# Patient Record
Sex: Female | Born: 1965 | Race: White | Hispanic: No | Marital: Married | Smoking: Current every day smoker
Health system: Southern US, Community
[De-identification: ages and names within clinical notes are randomized; demographics above are authoritative.]

## PROBLEM LIST (undated history)

## (undated) DIAGNOSIS — F419 Anxiety disorder, unspecified: Secondary | ICD-10-CM

## (undated) DIAGNOSIS — M4306 Spondylolysis, lumbar region: Secondary | ICD-10-CM

## (undated) DIAGNOSIS — I10 Essential (primary) hypertension: Secondary | ICD-10-CM

## (undated) DIAGNOSIS — M533 Sacrococcygeal disorders, not elsewhere classified: Secondary | ICD-10-CM

## (undated) DIAGNOSIS — F329 Major depressive disorder, single episode, unspecified: Secondary | ICD-10-CM

## (undated) DIAGNOSIS — F32A Depression, unspecified: Secondary | ICD-10-CM

## (undated) HISTORY — PX: ABDOMINAL HYSTERECTOMY: SHX81

## (undated) HISTORY — DX: Spondylolysis, lumbar region: M43.06

## (undated) HISTORY — PX: APPENDECTOMY: SHX54

## (undated) HISTORY — DX: Sacrococcygeal disorders, not elsewhere classified: M53.3

## (undated) HISTORY — DX: Anxiety disorder, unspecified: F41.9

## (undated) HISTORY — PX: CHOLECYSTECTOMY: SHX55

## (undated) HISTORY — DX: Essential (primary) hypertension: I10

---

## 2004-01-27 ENCOUNTER — Ambulatory Visit: Payer: Self-pay | Admitting: Internal Medicine

## 2005-08-22 ENCOUNTER — Emergency Department: Payer: Self-pay | Admitting: Emergency Medicine

## 2005-09-25 ENCOUNTER — Ambulatory Visit: Payer: Self-pay

## 2006-01-01 ENCOUNTER — Ambulatory Visit (HOSPITAL_COMMUNITY): Admission: RE | Admit: 2006-01-01 | Discharge: 2006-01-02 | Payer: Self-pay | Admitting: Neurosurgery

## 2006-05-15 ENCOUNTER — Ambulatory Visit: Payer: Self-pay | Admitting: Gastroenterology

## 2007-06-09 ENCOUNTER — Ambulatory Visit: Payer: Self-pay | Admitting: Internal Medicine

## 2007-07-11 ENCOUNTER — Ambulatory Visit: Payer: Self-pay | Admitting: Neurosurgery

## 2007-07-12 ENCOUNTER — Ambulatory Visit: Payer: Self-pay | Admitting: Neurosurgery

## 2009-02-02 ENCOUNTER — Emergency Department: Payer: Self-pay | Admitting: Emergency Medicine

## 2009-02-09 ENCOUNTER — Emergency Department: Payer: Self-pay | Admitting: Emergency Medicine

## 2009-02-14 ENCOUNTER — Emergency Department: Payer: Self-pay | Admitting: Internal Medicine

## 2010-03-20 ENCOUNTER — Emergency Department: Payer: Self-pay | Admitting: Internal Medicine

## 2011-01-15 ENCOUNTER — Ambulatory Visit: Payer: Self-pay | Admitting: Anesthesiology

## 2011-01-18 ENCOUNTER — Ambulatory Visit: Payer: Self-pay | Admitting: Unknown Physician Specialty

## 2011-03-12 ENCOUNTER — Emergency Department: Payer: Self-pay | Admitting: Emergency Medicine

## 2011-03-12 LAB — ETHANOL: Ethanol %: <0.003 % (ref 0.000–0.080)

## 2011-03-12 LAB — COMPREHENSIVE METABOLIC PANEL
Albumin: 4.3 g/dL (ref 3.4–5.0)
Anion Gap: 9 (ref 7–16)
Bilirubin,Total: 0.5 mg/dL (ref 0.2–1.0)
Chloride: 108 mmol/L — ABNORMAL HIGH (ref 98–107)
Co2: 28 mmol/L (ref 21–32)
Creatinine: 0.79 mg/dL (ref 0.60–1.30)
EGFR (Non-African Amer.): >60
Osmolality: 288 (ref 275–301)

## 2011-03-12 LAB — DRUG SCREEN, URINE
Amphetamines, Ur Screen: NEGATIVE (ref ?–1000)
Methadone, Ur Screen: NEGATIVE (ref ?–300)
Tricyclic, Ur Screen: NEGATIVE (ref ?–1000)

## 2011-03-12 LAB — CBC
HCT: 43.8 % (ref 35.0–47.0)
RBC: 4.94 10*6/uL (ref 3.80–5.20)
RDW: 14.3 % (ref 11.5–14.5)
WBC: 9.9 10*3/uL (ref 3.6–11.0)

## 2011-03-12 LAB — TSH: Thyroid Stimulating Horm: 3.68 u[IU]/mL

## 2011-06-25 ENCOUNTER — Ambulatory Visit: Payer: Self-pay | Admitting: Emergency Medicine

## 2011-06-25 LAB — HEPATIC FUNCTION PANEL A (ARMC)
Albumin: 3.8 g/dL (ref 3.4–5.0)
Bilirubin, Direct: <0.1 mg/dL (ref 0.00–0.20)
SGOT(AST): 16 U/L (ref 15–37)

## 2011-06-28 ENCOUNTER — Ambulatory Visit: Payer: Self-pay | Admitting: Emergency Medicine

## 2011-08-02 ENCOUNTER — Ambulatory Visit: Payer: Self-pay | Admitting: Emergency Medicine

## 2011-11-28 ENCOUNTER — Ambulatory Visit: Payer: Self-pay | Admitting: Internal Medicine

## 2011-12-31 ENCOUNTER — Ambulatory Visit: Payer: Self-pay | Admitting: Obstetrics & Gynecology

## 2011-12-31 LAB — CBC
HCT: 41.1 % (ref 35.0–47.0)
HGB: 13.7 g/dL (ref 12.0–16.0)
Platelet: 287 10*3/uL (ref 150–440)
RBC: 4.48 10*6/uL (ref 3.80–5.20)
RDW: 14 % (ref 11.5–14.5)
WBC: 7.6 10*3/uL (ref 3.6–11.0)

## 2011-12-31 LAB — PREGNANCY, URINE: Pregnancy Test, Urine: NEGATIVE m[IU]/mL

## 2012-01-03 ENCOUNTER — Ambulatory Visit: Payer: Self-pay | Admitting: Obstetrics & Gynecology

## 2012-06-25 ENCOUNTER — Ambulatory Visit: Payer: Self-pay | Admitting: Internal Medicine

## 2013-12-22 IMAGING — US TRANSABDOMINAL ULTRASOUND OF PELVIS
1 series · 14 of 25 positions shown · non-contrast
Comparison: none

REASON FOR EXAM: Persistent Pelvic Pain
COMMENTS:

PROCEDURE:     FILS - FILS PELVIS NON-OB W/TRANSVAGINAL  - November 28, 2011  [DATE]
RESULT:     Comparison: None
TECHNIQUE: Multiple transabdominal gray-scale images and endovaginal
gray-scale images with doppler images of the pelvis performed.

[Series 1: transabdominal ultrasound of pelvis · 0.26mm/px · 14 of 83 slices shown]
[im 1/83]
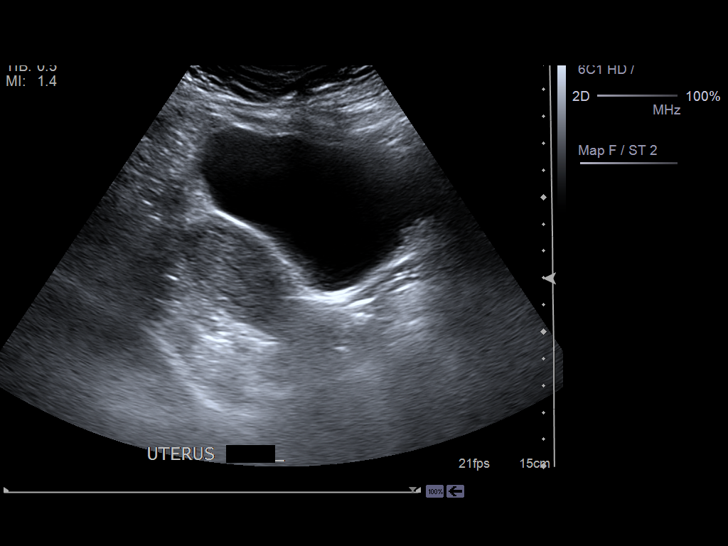
[im 7/83]
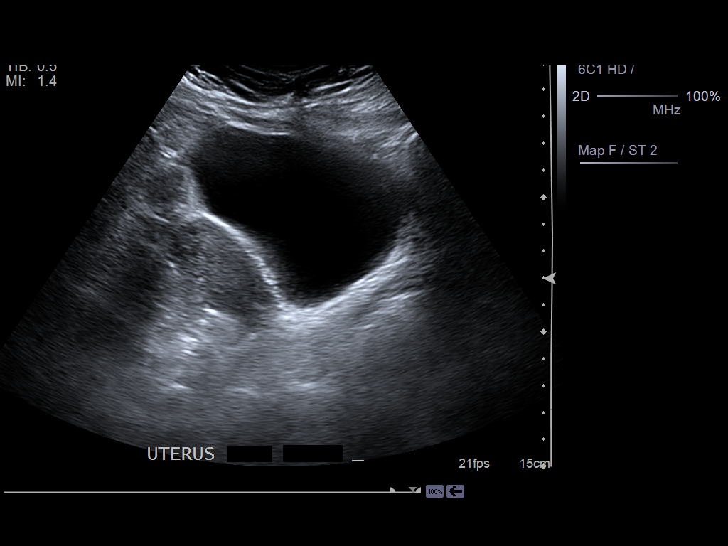
[im 14/83]
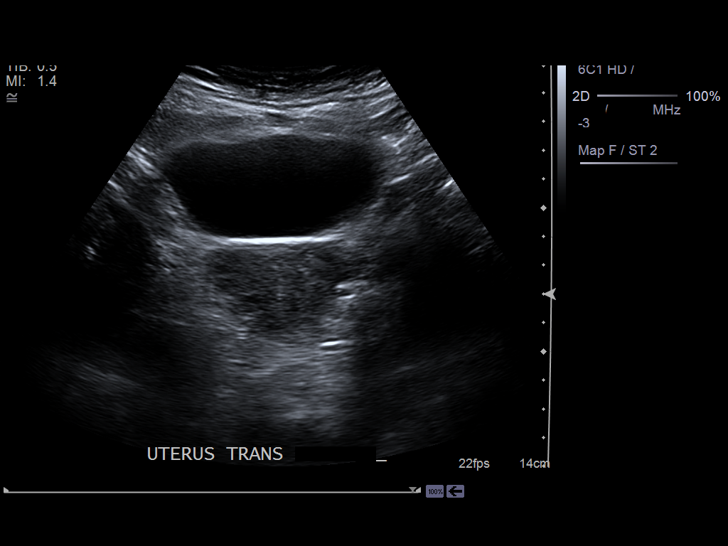
[im 21/83]
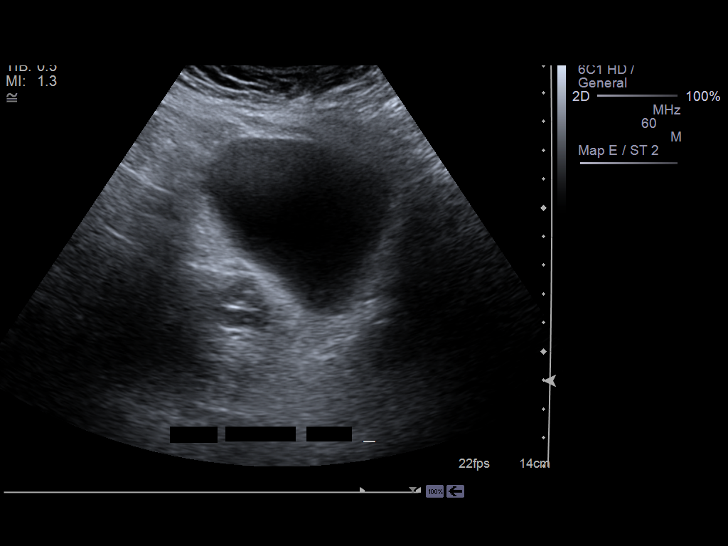
[im 28/83]
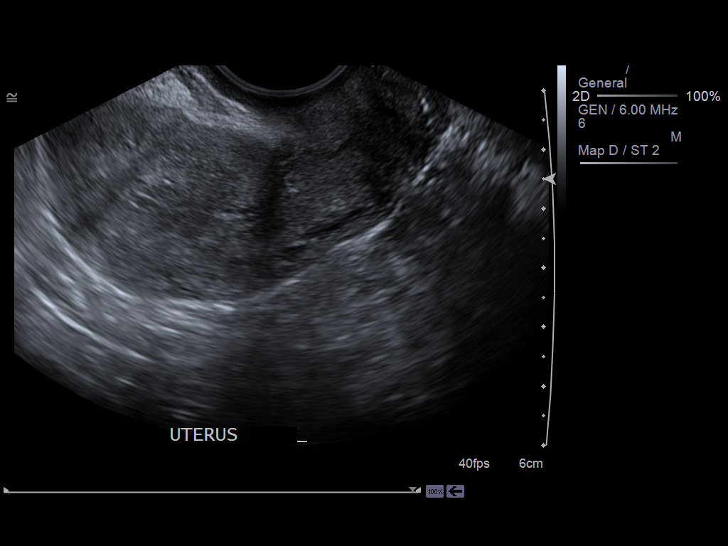
[im 31/83]
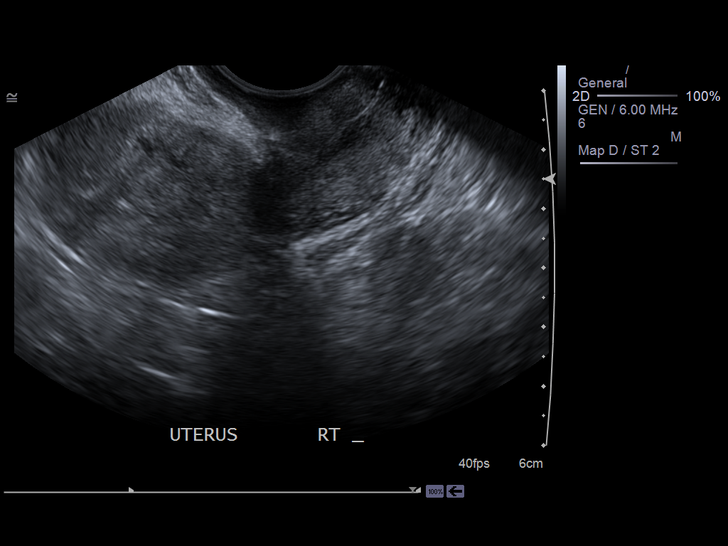
[im 38/83]
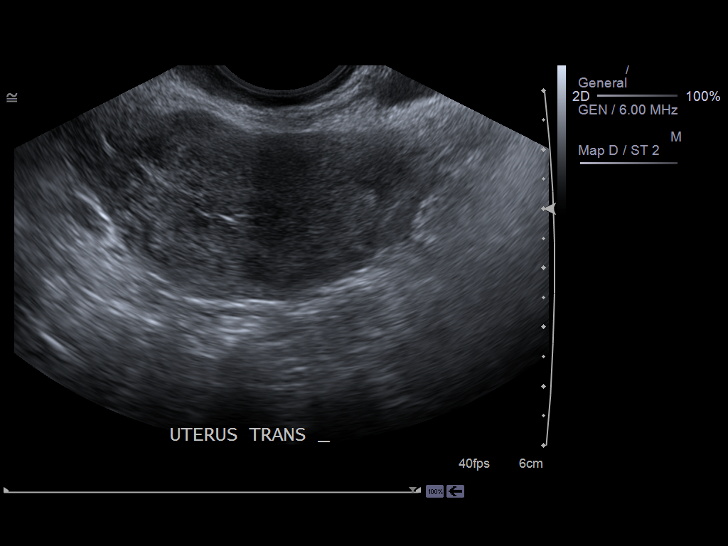
[im 45/83]
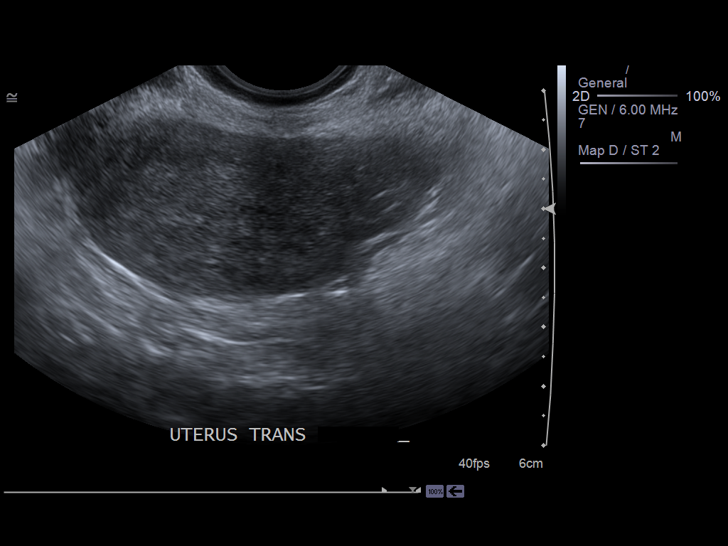
[im 52/83]
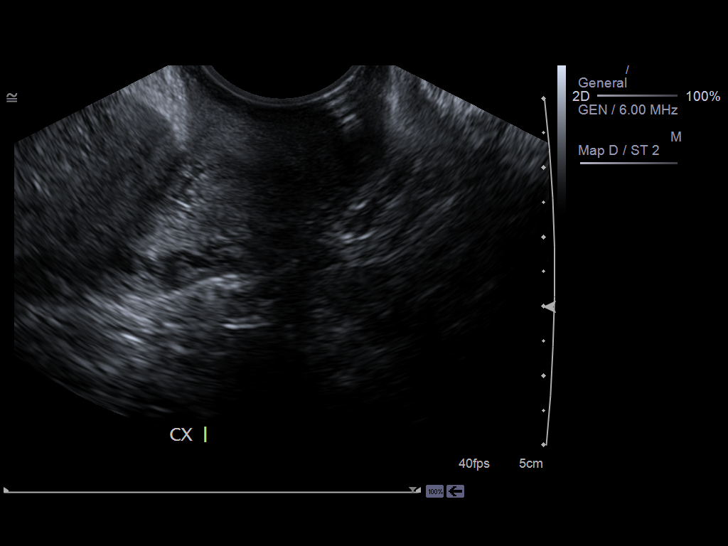
[im 55/83]
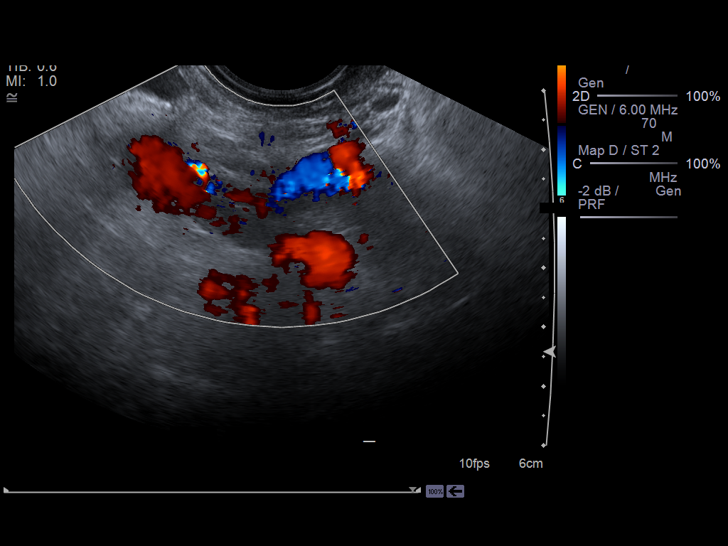
[im 62/83]
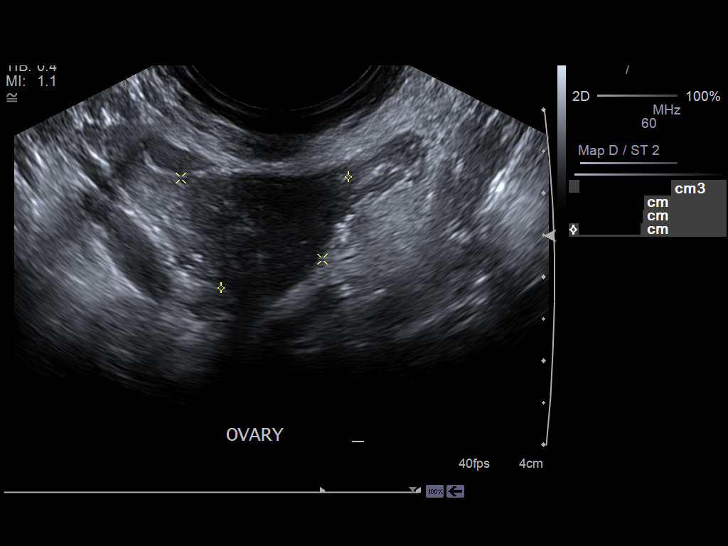
[im 69/83]
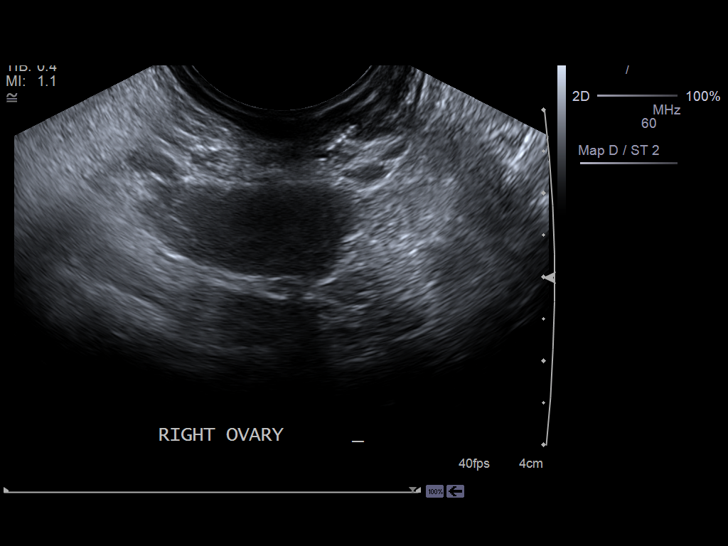
[im 76/83]
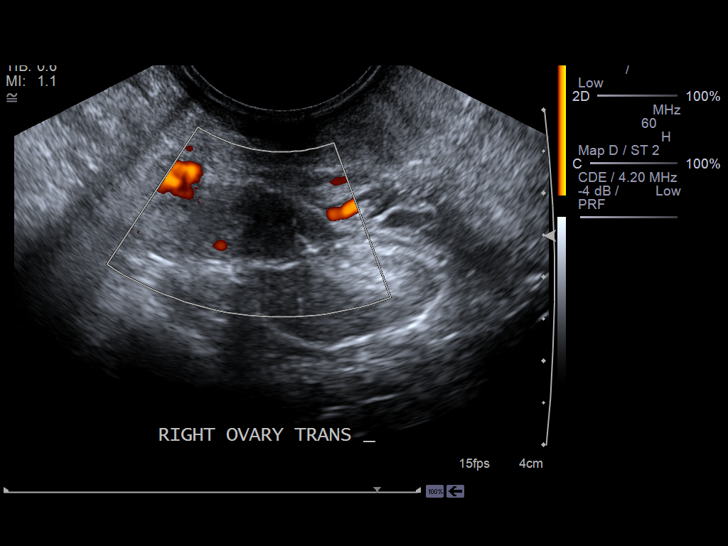
[im 83/83]
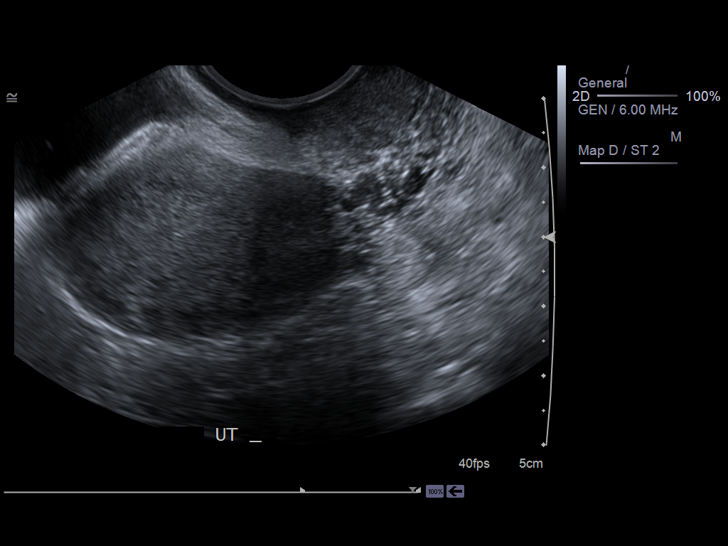

[14 of 25 positions shown; findings below may reference images not displayed]

FINDINGS: The uterus is normal in echotexture measuring 7.4 x 3.1 x 3.8 cm , with
transabdominal ultrasound. The endometrial stripe is uniform and homogeneous
measuring 3.4 mm.  There is a small calcification within the endometrial
cavity measuring 1.5 mm. There are no abnormal solid or cystic myometrial
mass lesions noted.

The right ovary measures 2.3 x 1.2 x 1.5 cm.  The left ovary measures 2 x 2
by 1.1 cm. There is no adnexal mass. There is prominent left pelvic
vasculature.

There is no pelvic free fluid.
IMPRESSION: No acute sonographic abnormality of the pelvis.

[REDACTED]

## 2014-06-01 NOTE — Op Note (Signed)
PATIENT NAME:  [name] MR#:  [id number] DATE OF BIRTH:  1965-03-16  DATE OF PROCEDURE:  01/03/2012  PREOPERATIVE DIAGNOSES:  1. Endometriosis. 2. Chronic pelvic pain. 3. Vulvar skin lesion.   POSTOPERATIVE DIAGNOSES: 1. Endometriosis. 2. Chronic pelvic pain. 3. Vulvar skin lesion.   PROCEDURE:  1. Laparoscopic supracervical hysterectomy with bilateral salpingo-oophorectomy. 2. Excision of vulvar skin lesion.   SURGEON: Glean Salen, MD   ASSISTANT: Erik Obey, MD    ANESTHESIA: General.   ESTIMATED BLOOD LOSS: 25 mL.   COMPLICATIONS: None.   FINDINGS: There was a bulbar skin lesion in the mons pubis area that was excised. There was significant endometriosis throughout the pelvic cavity including the uterus, fallopian tubes, posterior cul-de-sac, uterosacral ligaments. There was also a Filshie clip that was in the left ovarian fossa covered by peritoneum.   DISPOSITION: To recovery room in stable condition.   TECHNIQUE: The patient is prepped and draped in the usual sterile fashion after adequate anesthesia is obtained in the dorsal lithotomy position. A sponge stick is placed in the vagina for manipulation purposes and a Foley catheter is inserted. Vulvar skin lesion is identified. The skin in this area is infiltrated with 1% lidocaine with epinephrine. The lesion is grasped with an Allis clamp and excised with a scalpel. A 3-0 Vicryl suture is used to close the skin defect from this excision site followed by Dermabond.   Attention is then turned to the abdomen where a Veress needle is inserted through a 5 mm infraumbilical incision after Marcaine is used to anesthetize the skin. Veress needle placement is confirmed using the hanging drop technique and the abdomen is then insufflated with CO2 gas. A 5 mm trocar is then inserted under direct visualization with the laparoscope with no injuries or bleeding noted. The patient is placed in Trendelenburg positioning and the  above-mentioned findings are visualized.   A 5 mm trocar is placed in the left lower quadrant lateral to the inferior epigastric blood vessels and an 11 mm trocar is placed in the right lower quadrant lateral to the inferior epigastric blood vessels with no injuries or bleeding noted. The uterus, fallopian tubes, and ovaries were identified with the above-mentioned findings. The ureters are visually identified and out of harm's way throughout the dissection of the case. There is a band of adhesions from the anterior abdominal wall to the omentum. This is a fatty band of scar tissue with no bowel involvement. It is dissected free using the 5 mm Harmonic scalpel for better visualization purposes for the hysterectomy. The bowel is retracted superiorly and there was no apparent injury or involvement of the bowel throughout the surgery.   The uterus is grasped with a tenaculum along with the adnexa. The infundibulopelvic blood vessels and ligaments are carefully coagulated and cut using the bipolar cautery device along with the 5 mm Harmonic scalpel to completely excise the fallopian tubes and ovaries to the level of the round ligaments. Round ligaments are carefully coagulated and cut and dissection is carried down the broad ligament complex to the level of the uterine arteries. The uterine arteries are coagulated using bipolar cautery device. The bladder is dissected in an inferior direction. The uterus is amputated with approximately 1 cm of cervix left in place. The endocervical canal is cauterized. Excellent hemostasis is noted.   A morcellator device is placed through the right lower quadrant incision after the trocar is removed and then the uterus and adnexa is removed by morcellation process  without complication. The pelvic cavity is irrigated with aspiration of all fluid. Minimal blood loss is noted with currently excellent hemostasis. Ureters were visualized to be normal and without injury. Bladder is  also observed to be normal without injury and the bladder bag reveals clear yellow urine without gas, air, or blood. Interceed is placed over the cervical stump.   Fascial closure device is utilized to close the right lower quadrant incision at the level of the fascia using a Vicryl suture. Gas is then expelled through the trocars and they are removed carefully. As much gas as possible is removed from her abdomen. Skin incisions are closed with Dermabond at this time. The patient goes to the recovery room in stable condition. All sponge, instrument, and needle counts are correct.   ____________________________ R. Barnett Applebaum, MD rph:drc D: 01/03/2012 10:37:30 ET T: 01/03/2012 10:57:43 ET JOB#: [id number]  cc: Glean Salen, MD, <Dictator> Gae Dry MD ELECTRONICALLY SIGNED 01/04/2012 7:21

## 2014-06-06 NOTE — Op Note (Signed)
PATIENT NAME:  [name], MONTE MR#:  [id number] DATE OF BIRTH:  08-Apr-1965  DATE OF PROCEDURE:  08/02/2011  PREOPERATIVE DIAGNOSIS: Internal and external hemorrhoids and rectal bleeding.   POSTOPERATIVE DIAGNOSIS: Internal and external hemorrhoids and rectal bleeding.   PROCEDURE:  Hemorrhoidectomy.   SURGEON: Vella Kohler, MD  INDICATIONS: This is a patient who has been having bleeding from the rectum. She also had prolapse of a little bit of rectal mucosa. I did a colonoscopy which was basically normal. The patient was then brought to surgery. I explained to her the possibility of postoperative bleeding and rectal pain and formation of an anal fissure, in preop before surgery. The patient was then brought to surgery.   DESCRIPTION OF PROCEDURE: She was placed in a jackknife position. The buttocks were held apart.  There were three major areas of hemorrhoids and there were hemorrhoids in between. The one area at 7 o'clock position was then grasped with a lot of skin hanging and also a lot of skin tags were hanging with it.  A V-shaped incision was made on the skin around the hemorrhoid tissue and it was then dissected off the external sphincter and then with the Harmonic scalpel the hemorrhoids and mucosa was then taken off with multiple applications. The apex was then tied with 4-0 Vicryl sutures and then the suture was then brought in with interlocking sutures all the way down to the skin level.   Similarly, there were two more areas that were dealt with and one extra area at the 2 o'clock  position also was then dealt with. After this was done, we made sure there was mucosa in         between the hemorrhoidectomies and then we placed Xylocaine viscous on it and a dressing was then applied. I checked for bleeding, there was no bleeding. The patient was then discharged to the postoperative area. ____________________________ Welford Roche. Phylis Bougie, MD msh:slb D: 08/02/2011 08:50:32  ET T: 08/02/2011 10:19:42 ET JOB#: [id number]  cc: Jennyfer Nickolson S. Phylis Bougie, MD, <Dictator> Sharene Butters MD ELECTRONICALLY SIGNED 08/03/2011 14:42

## 2014-06-06 NOTE — Op Note (Signed)
PATIENT NAME:  [name], [name] MR#:  [id number] DATE OF BIRTH:  1965-10-28  DATE OF PROCEDURE:  01/18/2011  PREOPERATIVE DIAGNOSIS: Carpal tunnel syndrome, right side.  POSTOPERATIVE DIAGNOSIS: Carpal tunnel syndrome, right side.   PROCEDURE: Release of median nerve, right wrist.   SURGEON: Ronrico Dupin C. Nailah Luepke, MD   ASSISTANT: None.   ANESTHESIA: MAC.   ESTIMATED BLOOD LOSS: Negligible.   COMPLICATIONS: None.   BRIEF CLINICAL NOTE AND PATHOLOGY: The patient had documented bilateral carpal tunnel syndrome with significant pain. Options, risks, and benefits were discussed as she did not respond to conservative treatment. The patient elected to proceed with release. At the time of the procedure, there was marked compression of the median nerve with thickening of the transverse carpal ligament.   PROCEDURE: Preop antibiotics, adequate anesthesia, supine position, routine prepping and draping. Appropriate time-out was called. Routine incision was made paralleling the thenar crease in line with the ring finger. Subcutaneous tissues were dissected. Hemostasis was obtained with the bipolar cautery. The transverse carpal ligament was released. Proximal fascia was released subcutaneously. The carpal canal was explored and no further pathology noted. The incision was thoroughly irrigated. The skin was closed with Monocryl. Soft sterile dressing was applied. The patient was awakened and taken to the PAC-U having tolerated the procedure well.    ____________________________ Alysia Penna. Mauri Pole, MD jcc:drc D: 01/18/2011 14:51:16 ET T: 01/18/2011 15:20:24 ET JOB#: [id number] Alysia Penna Darshan Solanki MD ELECTRONICALLY SIGNED 02/13/2011 13:28

## 2014-06-06 NOTE — Op Note (Signed)
PATIENT NAME:  [name], [name] MR#:  [id number] DATE OF BIRTH:  [birth year]  DATE OF PROCEDURE:  06/28/2011  PREOPERATIVE DIAGNOSIS: Acute cholecystitis.   POSTOPERATIVE DIAGNOSIS: Acute cholecystitis.   PROCEDURE PERFORMED: Laparoscopic cholecystectomy with cholangiogram.   SURGEON: Idamay Hosein S. Greysen Devino, MD  HISTORY OF PRESENT ILLNESS: This 49 year old white female was seen by me in the office because of a lot of pain in the right upper quadrant of the abdomen. Patient also has history of a large hemorrhoids and bleeding from the rectum. She wanted to have the gallbladder fixed first.   OPERATIVE FINDINGS: Patient had small contracted gallbladder full of stones and very thick dilated cystic duct. It was hard to differentiate where the cystic duct finishes and where the gallbladder finishes.    DESCRIPTION OF PROCEDURE: Under general anesthesia, the abdomen was then prepped and draped. Small incision was made. After cutting skin and subcutaneous tissue, the fascia was then cut. The abdomen was then entered with direct vision. After that another trocar was put in the epigastric region, two 5 mm put in the right upper quadrant of the abdomen and CO2 was insufflated. Gallbladder found to be small but a lot of adhesions and it was contracted and it is in the middle of the liver and the liver was quite big so the gallbladder was way down in the subhepatic space. The gallbladder was then lifted up as much as we could do. Dissection was done. It was thickened and had a lot of stones. Hard putting clamps on to dissect it. Finally slowly, slowly dissected after cystic artery was dissected. It was then clipped and after that gallbladder was then lifted up. First of all I did quite a bit of dissection in the liver bed area so released the gallbladder off the liver bed so we know where the common duct is. Since the cystic duct and the gallbladder looked like the same size and it was dilated all the way down so after  an area which I thought was ampulla was grasped there and did a cholangiogram with a Kumar cholangiogram from there. Cholangiogram showed the common duct was dilated but I did not see any stones and dye went into the duodenum very nicely. When we took out the cholangiogram catheter we could see a lot of gravel and junk was coming through that opening so I made this opening bigger with scissor and then milked the stone up from there, lots of small and large size stones were then completely pulled upward, upward, up and after there was no more stones we clipped the cystic duct three times. It was very thickened and finally we clipped it and take off the gallbladder from the liver bed. Irrigation of the area was done, made sure there was no leakage of bile or anything else. No bleeding was noticed. After this was done, all the trocars were then examined and made sure there was no bleeding coming from there and the gallbladder was then removed from the epigastric port. After this was done, irrigation was done completely and the fascia was closed with interrupted 0 Vicryl suture. Marcaine was injected. Staples applied. Patient tolerated well. Sent to recovery room in satisfactory condition.    ____________________________ Welford Roche Phylis Bougie, MD msh:cms D: 06/28/2011 12:25:42 ET T: 06/28/2011 12:40:07 ET JOB#: [id number]  cc: Chae Oommen S. Phylis Bougie, MD, <Dictator> Lavera Guise, MD Sharene Butters MD ELECTRONICALLY SIGNED 07/05/2011 8:39

## 2016-01-03 ENCOUNTER — Emergency Department
Admission: EM | Admit: 2016-01-03 | Discharge: 2016-01-03 | Disposition: A | Discharge status: Home / Self Care | Payer: Self-pay | Attending: Emergency Medicine | Admitting: Emergency Medicine

## 2016-01-03 DIAGNOSIS — F1510 Other stimulant abuse, uncomplicated: Secondary | ICD-10-CM | POA: Insufficient documentation

## 2016-01-03 DIAGNOSIS — Z5181 Encounter for therapeutic drug level monitoring: Secondary | ICD-10-CM | POA: Insufficient documentation

## 2016-01-03 DIAGNOSIS — F17210 Nicotine dependence, cigarettes, uncomplicated: Secondary | ICD-10-CM | POA: Insufficient documentation

## 2016-01-03 DIAGNOSIS — F22 Delusional disorders: Secondary | ICD-10-CM | POA: Insufficient documentation

## 2016-01-03 HISTORY — DX: Depression, unspecified: F32.A

## 2016-01-03 HISTORY — DX: Major depressive disorder, single episode, unspecified: F32.9

## 2016-01-03 LAB — CBC
HEMATOCRIT: 39.2 % (ref 35.0–47.0)
Hemoglobin: 13.3 g/dL (ref 12.0–16.0)
MCH: 30 pg (ref 26.0–34.0)
MCHC: 34 g/dL (ref 32.0–36.0)
MCV: 88.3 fL (ref 80.0–100.0)
PLATELETS: 288 10*3/uL (ref 150–440)
RBC: 4.44 MIL/uL (ref 3.80–5.20)
RDW: 13.8 % (ref 11.5–14.5)
WBC: 8.1 10*3/uL (ref 3.6–11.0)

## 2016-01-03 LAB — COMPREHENSIVE METABOLIC PANEL
ALK PHOS: 80 U/L (ref 38–126)
ALT: 20 U/L (ref 14–54)
ANION GAP: 6 (ref 5–15)
AST: 24 U/L (ref 15–41)
Albumin: 3.9 g/dL (ref 3.5–5.0)
BILIRUBIN TOTAL: 0.6 mg/dL (ref 0.3–1.2)
BUN: 15 mg/dL (ref 6–20)
CALCIUM: 8.9 mg/dL (ref 8.9–10.3)
CO2: 31 mmol/L (ref 22–32)
Chloride: 101 mmol/L (ref 101–111)
Creatinine, Ser: 0.74 mg/dL (ref 0.44–1.00)
Glucose, Bld: 147 mg/dL — ABNORMAL HIGH (ref 65–99)
POTASSIUM: 3.9 mmol/L (ref 3.5–5.1)
Sodium: 138 mmol/L (ref 135–145)
TOTAL PROTEIN: 7 g/dL (ref 6.5–8.1)

## 2016-01-03 LAB — URINE DRUG SCREEN, QUALITATIVE (ARMC ONLY)
Amphetamines, Ur Screen: POSITIVE — AB
BARBITURATES, UR SCREEN: NOT DETECTED
BENZODIAZEPINE, UR SCRN: POSITIVE — AB
Cannabinoid 50 Ng, Ur ~~LOC~~: NOT DETECTED
Cocaine Metabolite,Ur ~~LOC~~: NOT DETECTED
MDMA (Ecstasy)Ur Screen: NOT DETECTED
METHADONE SCREEN, URINE: POSITIVE — AB
OPIATE, UR SCREEN: POSITIVE — AB
Phencyclidine (PCP) Ur S: NOT DETECTED
Tricyclic, Ur Screen: NOT DETECTED

## 2016-01-03 LAB — ETHANOL

## 2016-01-03 LAB — ACETAMINOPHEN LEVEL

## 2016-01-03 LAB — SALICYLATE LEVEL

## 2016-01-03 NOTE — ED Notes (Signed)
Pt changed into burgundy scrubs and clothes/slippers and purse placed in belongings bag and stored in locked area.Marland Kitchen
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
## 2016-01-03 NOTE — ED Notes (Signed)
Pt given meal tray.
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
## 2016-01-03 NOTE — ED Provider Notes (Signed)
-----------------------------------------   5:57 PM on 01/03/2016 -----------------------------------------   Blood pressure 130/77, pulse 94, temperature 98 F (36.7 C), temperature source Oral, resp. rate 16, height 5\' 2"  (1.575 m), weight 225 lb (102.1 kg), SpO2 97 %.  The patient had no acute events since last update.  Calm and cooperative at this time.    Patient was evaluated by Dr. 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 The patient recently has been using and has not slept in 3 days because of being on a drug binge. The patient is awake and alert at this time. Is denying any suicidal or homicidal ideations. She is clinically sober and has good insight into her condition and says that she'll be able to follow-up with [facility] tomorrow for drug detox and rehabilitation. She'll be discharged home. She is understanding of the plan and willing to comply.    Orbie Pyo, MD 01/03/16 [phone]

## 2016-01-03 NOTE — BH Assessment (Signed)
Assessment Note  [name] is an 50 y.o. female who presents to the ER due to having concerns about scabs and picking her skin. According to the patient, her son brought her to the because of the scabs. When she brush her hair, it would hurt and she was unsure as to why she was having the sores a scabs.  With this Probation officer, patient denies SI/HI and AV/H.  Per the report ER MD (Dr. Alfred Levins), patient is picking at her skin and repots she have worms and bugs coming out of her skin. While in the ER, patient took a "booger out of her nose" and stated it was worm. Due to the "delusions," she is picking her skin to the point of having sores.    Patient denies having a current mental health outpatient provider. She was inpatient once, when she was 50 years old. She attempted to end her life, by overdosing on medications and cutting her wrist. She states she hasn't had any other inpatient hospitalizations and never followed up with outpatient.  Patient admits to abusing her prescription medications. She states, she use more of her pain medications (opioids) and Xanax, and then prescribed. During the interview, the patient was lethargic and drowsy. Several times throughout the interview, writer had to wake the patient up and repeat questions, to get an answer.   Past Medical History:  Past Medical History:  Diagnosis Date  . Depression     Past Surgical History:  Procedure Laterality Date  . ABDOMINAL HYSTERECTOMY    . APPENDECTOMY    . CESAREAN SECTION     X2  . CHOLECYSTECTOMY      Family History: No family history on file.  Social History:  reports that she has been smoking Cigarettes.  She has never used smokeless tobacco. She reports that she drinks alcohol. She reports that she does not use drugs.  Additional Social History:  Alcohol / Drug Use Pain Medications: See PTA Prescriptions: See PTA Over the Counter: See PTA History of alcohol / drug use?: Yes Longest period of  sobriety (when/how long): Unable to quantify Negative Consequences of Use: Personal relationships, Work / School Withdrawal Symptoms:  (Reports of none) Substance #1 Name of Substance 1: Xanax (abuses prescriptions) Substance #2 Name of Substance 2: "Pain pills (Opioids) (abuses prescriptions)  CIWA: CIWA-Ar BP: 134/78 Pulse Rate: 88 COWS:    Allergies: No Known Allergies  Home Medications:  (Not in a hospital admission)  OB/GYN Status:  No LMP recorded. Patient has had a hysterectomy.  General Assessment Data Location of Assessment: [facility] ED TTS Assessment: In system Is this a Tele or Face-to-Face Assessment?: Face-to-Face Is this an Initial Assessment or a Re-assessment for this encounter?: Initial Assessment Marital status: Married [place] name: n/a Is patient pregnant?: No Pregnancy Status: No Living Arrangements: Spouse/significant other, Children Can pt return to current living arrangement?: Yes Admission Status: Voluntary Is patient capable of signing voluntary admission?: Yes Referral Source: Self/Family/Friend Insurance type: None  Medical Screening Exam ([place]) Medical Exam completed: Yes  Crisis Care Plan Living Arrangements: Spouse/significant other, Children Legal Guardian: Other: (None) Name of Psychiatrist: Reports of none Name of Therapist: Reports of none  Education Status Is patient currently in school?: No Current Grade: n/a Highest grade of school patient has completed: 12th Grade Name of school: n/a Contact person: n/a  Risk to self with the past 6 months Suicidal Ideation: No Has patient been a risk to self within the past 6 months prior  to admission? : No Suicidal Intent: No Has patient had any suicidal intent within the past 6 months prior to admission? : No Is patient at risk for suicide?: No Suicidal Plan?: No Has patient had any suicidal plan within the past 6 months prior to admission? : No Access to Means: No What has  been your use of drugs/alcohol within the last 12 months?: Reports of abusing her prescription drugs. Previous Attempts/Gestures: Yes How many times?: 1 Other Self Harm Risks: Reports of none Triggers for Past Attempts: Family contact Intentional Self Injurious Behavior: None Family Suicide History: No Recent stressful life event(s): Other (Comment) (Reports of abusing her prescription drugs.) Persecutory voices/beliefs?: No Depression: Yes Depression Symptoms: Feeling worthless/self pity Substance abuse history and/or treatment for substance abuse?: No Suicide prevention information given to non-admitted patients: Not applicable  Risk to Others within the past 6 months Homicidal Ideation: No Does patient have any lifetime risk of violence toward others beyond the six months prior to admission? : No Thoughts of Harm to Others: No Current Homicidal Intent: No Current Homicidal Plan: No Access to Homicidal Means: No Identified Victim: Reports of none History of harm to others?: No Assessment of Violence: None Noted Violent Behavior Description: Reports of none Does patient have access to weapons?: No Criminal Charges Pending?: No Does patient have a court date: No Is patient on probation?: No  Psychosis Hallucinations: None noted Delusions: Somatic  Mental Status Report Appearance/Hygiene: Unremarkable, In scrubs Eye Contact: Poor Motor Activity: Unable to assess (Patient lying in the bed) Speech: Soft, Slurred, Logical/coherent Level of Consciousness: Drowsy Mood: Anxious, Helpless, Pleasant Affect: Appropriate to circumstance Anxiety Level: Minimal Thought Processes: Relevant, Coherent Judgement: Partial Orientation: Person, Place, Time, Appropriate for developmental age Obsessive Compulsive Thoughts/Behaviors: Minimal  Cognitive Functioning Concentration: Decreased Memory: Recent Intact, Remote Intact IQ: Average Insight: Fair Impulse Control: Poor Appetite:  Good Weight Loss: 0 Weight Gain: 0 Sleep: No Change Total Hours of Sleep: 8 Vegetative Symptoms: None  ADLScreening [facility] Assessment Services) Patient's cognitive ability adequate to safely complete daily activities?: Yes Patient able to express need for assistance with ADLs?: Yes Independently performs ADLs?: Yes (appropriate for developmental age)  Prior Inpatient Therapy Prior Inpatient Therapy: Yes Prior Therapy Dates: 1984 Prior Therapy Facilty/Provider(s): Patient doesn't remember the name of hospital Reason for Treatment: Suicide attempt  Prior Outpatient Therapy Prior Outpatient Therapy: No Prior Therapy Dates: Reports of none Prior Therapy Facilty/Provider(s): Reports of none Reason for Treatment: Reports of none Does patient have an ACCT team?: No Does patient have Intensive In-House Services?  : No Does patient have [facility] services? : No Does patient have P4CC services?: No  ADL Screening (condition at time of admission) Patient's cognitive ability adequate to safely complete daily activities?: Yes Is the patient deaf or have difficulty hearing?: No Does the patient have difficulty seeing, even when wearing glasses/contacts?: No Does the patient have difficulty concentrating, remembering, or making decisions?: No Patient able to express need for assistance with ADLs?: Yes Does the patient have difficulty dressing or bathing?: No Independently performs ADLs?: Yes (appropriate for developmental age) Does the patient have difficulty walking or climbing stairs?: No Weakness of Legs: None Weakness of Arms/Hands: None  Home Assistive Devices/Equipment Home Assistive Devices/Equipment: None  Therapy Consults (therapy consults require a physician order) PT Evaluation Needed: No OT Evalulation Needed: No SLP Evaluation Needed: No Abuse/Neglect Assessment (Assessment to be complete while patient is alone) Physical Abuse: Denies Verbal Abuse: Denies Sexual Abuse:  Denies Exploitation of patient/patient's resources:  Denies Self-Neglect: Denies Values / Beliefs Cultural Requests During Hospitalization: None Spiritual Requests During Hospitalization: None Consults Spiritual Care Consult Needed: No Social Work Consult Needed: No Regulatory affairs officer (For Healthcare) Does Patient Have a Medical Advance Directive?: No Would patient like information on creating a medical advance directive?: No - patient declined information    Additional Information 1:1 In Past 12 Months?: No CIRT Risk: No Elopement Risk: No Does patient have medical clearance?: Yes  Child/Adolescent Assessment Running Away Risk: Denies (Patient is an adult)  Disposition:  Disposition Initial Assessment Completed for this Encounter: Yes Disposition of Patient: Other dispositions (ER MD ordered Psych Consult [facility]))  On Site Evaluation by:   Reviewed with Physician:    Gunnar Fusi MS, LCAS, [place], [place], CCSI Therapeutic Triage Specialist 01/03/2016 8:08 PM

## 2016-01-03 NOTE — ED Notes (Signed)
Pt gave me the phone and told me to talk to husband.I asked her permission which she verbally gave. Husband wanted to know what was going on. I told him that the pt was tearful, crying, sad, and thinking that worms were crawling on skin. That she had been evaluated be edp and she did not think sores were made by bugs and felt that the pt needed to be seen by psychiatry. Husband told me that he was leaving [place] and wanted to visit. I informed him of visitation policy. Husband said he would call son and have son pick up pt. He then ended call. I notified edp of conversation.

## 2016-01-03 NOTE — ED Notes (Signed)
Talked to pts son, Jaelie Souliere, contact [phone], in person after obtaining verbal permission from pt. Son related that husband was concerned that if pt was here after 1500 then she would have to spend the night. I reassured him that was not the case. I told him that his mom was obviously in distress, that she was making the sores on her head and face, and that she needed to be seen by psychiatry. He was in agreement. He stated that mom takes too many pills. Percocet, xanax, adderol, and another kind of pain med. Asked if that could be the problem. I asked Dr. Alfred Levins to talk to him, which she did.

## 2016-01-03 NOTE — ED Notes (Signed)
Pt alert, oriented to person, place. Perrl. Numerous scabs on face, scalp, ears. A few places on chest. Pt states she feels worms crawling on her. Pt worried that "we" think she is crazy and she is not. Pt admits to buying and using xanax. Last used last night. Also states she uses adderal and percocet. Denies pain at this time. Pt anxious, tearful.

## 2017-03-15 ENCOUNTER — Emergency Department: Admission: EM | Admit: 2017-03-15 | Discharge: 2017-03-15 | Discharge status: Against Medical Advice | Payer: Self-pay

## 2017-03-15 NOTE — ED Notes (Signed)
Pt called to room, no response.

## 2017-03-15 NOTE — ED Notes (Signed)
Pt called no response

## 2017-03-15 NOTE — ED Triage Notes (Signed)
Pt called for room, no response. 

## 2017-12-28 ENCOUNTER — Emergency Department
Admission: EM | Admit: 2017-12-28 | Discharge: 2017-12-28 | Disposition: A | Discharge status: Home / Self Care | Payer: Self-pay | Attending: Emergency Medicine | Admitting: Emergency Medicine

## 2017-12-28 ENCOUNTER — Encounter: Payer: Self-pay | Admitting: Emergency Medicine

## 2017-12-28 ENCOUNTER — Other Ambulatory Visit: Payer: Self-pay

## 2017-12-28 DIAGNOSIS — S61210A Laceration without foreign body of right index finger without damage to nail, initial encounter: Secondary | ICD-10-CM | POA: Insufficient documentation

## 2017-12-28 DIAGNOSIS — Y929 Unspecified place or not applicable: Secondary | ICD-10-CM | POA: Insufficient documentation

## 2017-12-28 DIAGNOSIS — Z23 Encounter for immunization: Secondary | ICD-10-CM | POA: Insufficient documentation

## 2017-12-28 DIAGNOSIS — F17210 Nicotine dependence, cigarettes, uncomplicated: Secondary | ICD-10-CM | POA: Insufficient documentation

## 2017-12-28 DIAGNOSIS — Y93H1 Activity, digging, shoveling and raking: Secondary | ICD-10-CM | POA: Insufficient documentation

## 2017-12-28 DIAGNOSIS — W269XXA Contact with unspecified sharp object(s), initial encounter: Secondary | ICD-10-CM | POA: Insufficient documentation

## 2017-12-28 DIAGNOSIS — Y999 Unspecified external cause status: Secondary | ICD-10-CM | POA: Insufficient documentation

## 2017-12-28 MED ORDER — NAPROXEN 500 MG PO TABS
500.0000 mg | ORAL_TABLET | Freq: Once | ORAL | Status: AC
  Administered 2017-12-28: 500 mg via ORAL
  Filled 2017-12-28: qty 1

## 2017-12-28 MED ORDER — LIDOCAINE HCL (PF) 1 % IJ SOLN
INTRAMUSCULAR | Status: AC
  Administered 2017-12-28: 5 mL
  Filled 2017-12-28: qty 5

## 2017-12-28 MED ORDER — SULFAMETHOXAZOLE-TRIMETHOPRIM 800-160 MG PO TABS
1.0000 | ORAL_TABLET | Freq: Once | ORAL | Status: AC
  Administered 2017-12-28: 1 via ORAL
  Filled 2017-12-28: qty 1

## 2017-12-28 MED ORDER — TRAMADOL HCL 50 MG PO TABS: 50.0000 mg | ORAL_TABLET | Freq: Two times a day (BID) | ORAL | 0 refills | Status: DC | PRN

## 2017-12-28 MED ORDER — LIDOCAINE HCL (PF) 1 % IJ SOLN
5.0000 mL | Freq: Once | INTRAMUSCULAR | Status: AC
  Administered 2017-12-28: 5 mL

## 2017-12-28 MED ORDER — TETANUS-DIPHTH-ACELL PERTUSSIS 5-2.5-18.5 LF-MCG/0.5 IM SUSP
0.5000 mL | Freq: Once | INTRAMUSCULAR | Status: AC
  Administered 2017-12-28: 0.5 mL via INTRAMUSCULAR
  Filled 2017-12-28: qty 0.5

## 2017-12-28 MED ORDER — BACITRACIN-NEOMYCIN-POLYMYXIN 400-5-5000 EX OINT
TOPICAL_OINTMENT | Freq: Once | CUTANEOUS | Status: AC
  Administered 2017-12-28: 1 via TOPICAL
  Filled 2017-12-28: qty 1

## 2017-12-28 MED ORDER — LIDOCAINE HCL (PF) 1 % IJ SOLN
INTRAMUSCULAR | Status: AC
  Filled 2017-12-28: qty 5

## 2017-12-28 MED ORDER — SULFAMETHOXAZOLE-TRIMETHOPRIM 800-160 MG PO TABS: 1.0000 | ORAL_TABLET | Freq: Two times a day (BID) | ORAL | 0 refills | Status: DC

## 2017-12-28 MED ORDER — NAPROXEN 500 MG PO TABS: 500.0000 mg | ORAL_TABLET | Freq: Two times a day (BID) | ORAL | Status: DC

## 2017-12-28 NOTE — ED Notes (Signed)
See triage note  States she was in woods looking for rocks  Was digging in the dirt  Laceration noted to right index finger  Very drowsy  Answer questions approp  States she has note slept in over 24 hours

## 2017-12-28 NOTE — ED Triage Notes (Addendum)
States was digging in dirt with hands about 30 minutes ago and cut index finger R hand. Noted drowsy in triage. States did not sleep last night and has been taking percocet for pain. Answers questions coherently. Arrives with her son with whom she lives and states he is the driver today.

## 2017-12-28 NOTE — ED Provider Notes (Signed)
[facility] Emergency Department Provider Note   ____________________________________________   First MD Initiated Contact with Patient 12/28/17 1339     (approximate)  I have reviewed the triage vital signs and the nursing notes.   HISTORY  Chief Complaint Laceration    HPI [name] is a 52 y.o. female patient presents for laceration to the MPJ of the second digit right hand.  Patient states she was digging it did looking for rocks.  Patient that she cut her hand on a piece of metal.  Incident occurred approximately 30 minutes prior to arrival.  Patient is lethargic and states she did not sleep last night and took a Percocet for pain prior to arrival.  Patient is alert and orientated x3.  Patient arrives with her son.  Patient is right-hand dominant.  Wound was bandaged in triage.  Patient rates pain as a 4/10.  No other palliative measure for complaint.  Patient tetanus shot is not up-to-date.  Past Medical History:  Diagnosis Date  . Depression     There are no active problems to display for this patient.   Past Surgical History:  Procedure Laterality Date  . ABDOMINAL HYSTERECTOMY    . APPENDECTOMY    . CESAREAN SECTION     X2  . CHOLECYSTECTOMY      Prior to Admission medications   Medication Sig Start Date End Date Taking? Authorizing Provider  oxyCODONE-acetaminophen (PERCOCET/ROXICET) 5-325 MG tablet Take 1 tablet by mouth every 4 (four) hours as needed for severe pain.   Yes [provider]  naproxen (NAPROSYN) 500 MG tablet Take 1 tablet (500 mg total) by mouth 2 (two) times daily with a meal. 12/28/17   Sable Feil, PA-C  sulfamethoxazole-trimethoprim (BACTRIM DS,SEPTRA DS) 800-160 MG tablet Take 1 tablet by mouth 2 (two) times daily. 12/28/17   Sable Feil, PA-C  traMADol (ULTRAM) 50 MG tablet Take 1 tablet (50 mg total) by mouth every 12 (twelve) hours as needed. 12/28/17   Sable Feil, PA-C     Allergies Patient has no known allergies.  No family history on file.  Social History Social History   Tobacco Use  . Smoking status: Current Every Day Smoker    Types: Cigarettes  . Smokeless tobacco: Never Used  Substance Use Topics  . Alcohol use: Yes  . Drug use: No    Review of Systems Constitutional: No fever/chills Eyes: No visual changes. ENT: No sore throat. Cardiovascular: Denies chest pain. Respiratory: Denies shortness of breath. Gastrointestinal: No abdominal pain.  No nausea, no vomiting.  No diarrhea.  No constipation. Genitourinary: Negative for dysuria. Musculoskeletal: Negative for back pain. Skin: Negative for rash. Neurological: Negative for headaches, focal weakness or numbness. Psychiatric:Depression.   ____________________________________________   PHYSICAL EXAM:  VITAL SIGNS: ED Triage Vitals  Enc Vitals Group     BP 12/28/17 1331 109/66     Pulse Rate 12/28/17 1331 93     Resp 12/28/17 1331 20     Temp 12/28/17 1331 98.3 F (36.8 C)     Temp Source 12/28/17 1331 Oral     SpO2 12/28/17 1331 96 %     Weight 12/28/17 1333 155 lb (70.3 kg)     Height 12/28/17 1333 5\' 2"  (1.575 m)     Head Circumference --      Peak Flow --      Pain Score 12/28/17 1333 4     Pain Loc --  Pain Edu? --      Excl. in [place]? --    Constitutional: Alert and oriented. Well appearing and in no acute distress.  Patient has become more alert since arrival. Cardiovascular: Normal rate, regular rhythm. Grossly normal heart sounds.  Good peripheral circulation. Respiratory: Normal respiratory effort.  No retractions. Lungs CTAB. Skin:  Skin is warm, dry and intact. No rash noted.  1 cm laceration dorsal aspect of the MPJ first digit right hand. Psychiatric: Mood and affect are normal. Speech and behavior are normal.  ____________________________________________   LABS (all labs ordered are listed, but only abnormal results are displayed)  Labs  Reviewed - No data to display ____________________________________________  EKG   ____________________________________________  RADIOLOGY  ED MD interpretation:    Official radiology report(s): No results found.  ____________________________________________   PROCEDURES  Procedure(s) performed: None  .Marland KitchenLaceration Repair Date/Time: 12/28/2017 2:47 PM Performed by: Sable Feil, PA-C Authorized by: Sable Feil, PA-C   Consent:    Consent obtained:  Verbal   Consent given by:  Patient   Risks discussed:  Infection, pain and poor cosmetic result Anesthesia (see MAR for exact dosages):    Anesthesia method:  Nerve block   Block needle gauge:  25 G   Block anesthetic:  Lidocaine 1% w/o epi   Block injection procedure:  Anatomic landmarks identified and incremental injection Laceration details:    Location:  Finger   Finger location:  R index finger   Length (cm):  1 Repair type:    Repair type:  Simple Pre-procedure details:    Preparation:  Patient was prepped and draped in usual sterile fashion Exploration:    Wound exploration: wound explored through full range of motion     Contaminated: yes   Treatment:    Area cleansed with:  Betadine and saline   Amount of cleaning:  Extensive   Irrigation solution:  Sterile saline   Irrigation method:  Pressure wash and syringe   Visualized foreign bodies/material removed: no   Skin repair:    Repair method:  Sutures   Suture size:  4-0   Suture material:  Prolene   Suture technique:  Simple interrupted   Number of sutures:  5 Approximation:    Approximation:  Close Post-procedure details:    Dressing:  Antibiotic ointment and non-adherent dressing   Patient tolerance of procedure:  Tolerated well, no immediate complications    Critical Care performed: No  ____________________________________________   INITIAL IMPRESSION / ASSESSMENT AND PLAN / ED COURSE  As part of my medical decision making, I  reviewed the following data within the [place]    Patient presents a laceration to the second digit right hand.  Area was clean and surgically closed.  Patient given discharge care instruction.  Patient given a tetanus shot.  Patient advised to follow-up in 10 days at this facility your family doctor suture removal.  Take medication as directed.      ____________________________________________   FINAL CLINICAL IMPRESSION(S) / ED DIAGNOSES  Final diagnoses:  Laceration of right index finger without foreign body without damage to nail, initial encounter     ED Discharge Orders         Ordered    sulfamethoxazole-trimethoprim (BACTRIM DS,SEPTRA DS) 800-160 MG tablet  2 times daily     12/28/17 1451    naproxen (NAPROSYN) 500 MG tablet  2 times daily with meals     12/28/17 1451    traMADol (ULTRAM) 50  MG tablet  Every 12 hours PRN     12/28/17 1451           Note:  This document was prepared using Dragon voice recognition software and may include unintentional dictation errors.    Sable Feil, PA-C 12/28/17 1452    Arta Silence, MD 12/28/17 1515

## 2021-05-30 ENCOUNTER — Other Ambulatory Visit: Payer: Self-pay

## 2021-05-30 ENCOUNTER — Emergency Department
Admission: EM | Admit: 2021-05-30 | Discharge: 2021-05-30 | Disposition: A | Discharge status: Home / Self Care | Payer: 59 | Attending: Emergency Medicine | Admitting: Emergency Medicine

## 2021-05-30 DIAGNOSIS — K047 Periapical abscess without sinus: Secondary | ICD-10-CM | POA: Insufficient documentation

## 2021-05-30 DIAGNOSIS — K0889 Other specified disorders of teeth and supporting structures: Secondary | ICD-10-CM | POA: Diagnosis present

## 2021-05-30 MED ORDER — CLINDAMYCIN HCL 150 MG PO CAPS
300.0000 mg | ORAL_CAPSULE | Freq: Once | ORAL | Status: AC
  Administered 2021-05-30: 300 mg via ORAL
  Filled 2021-05-30: qty 2

## 2021-05-30 MED ORDER — LIDOCAINE VISCOUS HCL 2 % MT SOLN: 10.0000 mL | OROMUCOSAL | 0 refills | Status: DC | PRN

## 2021-05-30 MED ORDER — OXYCODONE-ACETAMINOPHEN 5-325 MG PO TABS
1.0000 | ORAL_TABLET | Freq: Once | ORAL | Status: AC
  Administered 2021-05-30: 1 via ORAL
  Filled 2021-05-30: qty 1

## 2021-05-30 MED ORDER — CLINDAMYCIN HCL 300 MG PO CAPS: 300.0000 mg | ORAL_CAPSULE | Freq: Four times a day (QID) | ORAL | 0 refills | Status: AC

## 2021-05-30 MED ORDER — OXYCODONE-ACETAMINOPHEN 5-325 MG PO TABS: 1.0000 | ORAL_TABLET | Freq: Four times a day (QID) | ORAL | 0 refills | Status: DC | PRN

## 2021-05-30 MED ORDER — CHLORHEXIDINE GLUCONATE 0.12 % MT SOLN: 15.0000 mL | Freq: Two times a day (BID) | OROMUCOSAL | 0 refills | Status: DC

## 2021-05-30 NOTE — ED Provider Notes (Signed)
? ?[facility] ?Provider Note ? ?Patient Contact: 4:24 PM (approximate) ? ? ?History  ? ?Dental Pain ? ? ?HPI ? ?[name] is a 56 y.o. female who presents the emergency department complaining of right facial pain and dental pain.  Patient states that she has not broken dentition in the right upper dentition and has had issues with infections.  She recently had a oral abscess in the right upper dentition several months ago.  She states that she started developing pain, swelling along the right upper gumline as well as swelling in her right cheek as well.  No fevers or chills, difficulty breathing or swallowing.  She has an appointment to see her dentist and 5 days. ?  ? ? ?Physical Exam  ? ?Triage Vital Signs: ?ED Triage Vitals  ?Enc Vitals Group  ?   BP 05/30/21 1425 129/80  ?   Pulse Rate 05/30/21 1425 91  ?   Resp 05/30/21 1425 18  ?   Temp 05/30/21 1425 98.5 ?F (36.9 ?C)  ?   Temp Source 05/30/21 1425 Oral  ?   SpO2 05/30/21 1425 96 %  ?   Weight 05/30/21 1425 235 lb (106.6 kg)  ?   Height 05/30/21 1425 '5\' 2"'$  (1.575 m)  ?   Head Circumference --   ?   Peak Flow --   ?   Pain Score 05/30/21 1350 9  ?   Pain Loc --   ?   Pain Edu? --   ?   Excl. in [place]? --   ? ? ?Most recent vital signs: ?Vitals:  ? 05/30/21 1425  ?BP: 129/80  ?Pulse: 91  ?Resp: 18  ?Temp: 98.5 ?F (36.9 ?C)  ?SpO2: 96%  ? ? ? ?General: Alert and in no acute distress. ?ENT: ?     Ears:  ?     Nose: No congestion/rhinnorhea. ?     Mouth/Throat: Mucous membranes are moist.  Visualization of the right upper dentition reveals multiple broken in the right upper dentition.  Patient has swelling of the gumline but no appreciable abscess.  Patient does have overlying edema and erythema of the right cheek.  There is no periorbital involvement.  There is no extension of edema, erythema or tenderness into the submandibular region. ?Neck: No stridor. No cervical spine tenderness to palpation.  No erythema or edema of the anterior  neck. ?Hematological/Lymphatic/Immunilogical: Scattered right-sided cervical lymphadenopathy. ?Cardiovascular:  Good peripheral perfusion ?Respiratory: Normal respiratory effort without tachypnea or retractions. Lungs CTAB.  ?Musculoskeletal: Full range of motion to all extremities.  ?Neurologic:  No gross focal neurologic deficits are appreciated.  ?Skin:   No rash noted ?Other: ? ? ?ED Results / Procedures / Treatments  ? ?Labs ?(all labs ordered are listed, but only abnormal results are displayed) ?Labs Reviewed - No data to display ? ? ?EKG ? ? ? ? ?RADIOLOGY ? ? ? ?No results found. ? ?PROCEDURES: ? ?Critical Care performed: No ? ?Procedures ? ? ?MEDICATIONS ORDERED IN ED: ?Medications  ?clindamycin (CLEOCIN) capsule 300 mg (300 mg Oral Given 05/30/21 1639)  ?oxyCODONE-acetaminophen (PERCOCET/ROXICET) 5-325 MG per tablet 1 tablet (1 tablet Oral Given 05/30/21 1639)  ? ? ? ?IMPRESSION / MDM / ASSESSMENT AND PLAN / ED COURSE  ?I reviewed the triage vital signs and the nursing notes. ?             ?               ? ?  Differential diagnosis includes, but is not limited to, dental infection, dental abscess, facial cellulitis ? ? ?Patient's diagnosis is consistent with dental infection.  Patient presents the emergency department with multiple broken and eroded teeth in the right upper dentition.  Patient has findings consistent with infection.  There is no appreciable induration or fluctuance in the face.  There is no appreciable abscess in the gumline at this time to be drained.  Patient will be started on antibiotics, mouthwash to include chlorhexidine and Magic mouthwash for symptom relief.  Limited pain medication for the patient.  Return precaution discussed with the patient at length for any worsening signs of infection.  Follow-up with her dentist in 5 days..  Patient is given ED precautions to return to the ED for any worsening or new symptoms. ? ? ? ?  ? ? ?FINAL CLINICAL IMPRESSION(S) / ED DIAGNOSES  ? ?Final  diagnoses:  ?Dental infection  ? ? ? ?Rx / DC Orders  ? ?ED Discharge Orders   ? ? None  ? ?  ? ? ? ?Note:  This document was prepared using Dragon voice recognition software and may include unintentional dictation errors. ?  ?Darletta Moll, PA-C ?05/30/21 1650 ? ?  ?Harvest Dark, MD ?05/30/21 2025 ? ?

## 2021-05-30 NOTE — ED Triage Notes (Signed)
Pt c/o right upper tooth pain with facial swelling for the past couple of days.  ?

## 2021-11-23 ENCOUNTER — Encounter: Payer: Self-pay | Admitting: *Deleted

## 2021-11-23 ENCOUNTER — Other Ambulatory Visit: Payer: Self-pay | Admitting: *Deleted

## 2021-11-23 ENCOUNTER — Ambulatory Visit: Payer: 59 | Admitting: Physician Assistant

## 2021-11-23 ENCOUNTER — Telehealth: Payer: Self-pay

## 2021-11-23 NOTE — Telephone Encounter (Signed)
Patient had to miss appt today. Asked for someone to call and reschedule her.

## 2021-11-27 NOTE — Telephone Encounter (Signed)
Left message to call back for a new patient appt.  For February for the first available.   PEC may schedule.

## 2021-11-29 DIAGNOSIS — K219 Gastro-esophageal reflux disease without esophagitis: Secondary | ICD-10-CM | POA: Diagnosis not present

## 2021-11-29 DIAGNOSIS — J449 Chronic obstructive pulmonary disease, unspecified: Secondary | ICD-10-CM | POA: Diagnosis not present

## 2021-11-29 DIAGNOSIS — Z1231 Encounter for screening mammogram for malignant neoplasm of breast: Secondary | ICD-10-CM | POA: Diagnosis not present

## 2021-11-29 DIAGNOSIS — Z1389 Encounter for screening for other disorder: Secondary | ICD-10-CM | POA: Diagnosis not present

## 2021-11-29 DIAGNOSIS — I10 Essential (primary) hypertension: Secondary | ICD-10-CM | POA: Diagnosis not present

## 2021-12-13 ENCOUNTER — Telehealth: Payer: Self-pay

## 2021-12-13 ENCOUNTER — Other Ambulatory Visit: Payer: Self-pay

## 2021-12-13 DIAGNOSIS — Z1211 Encounter for screening for malignant neoplasm of colon: Secondary | ICD-10-CM

## 2021-12-13 MED ORDER — NA SULFATE-K SULFATE-MG SULF 17.5-3.13-1.6 GM/177ML PO SOLN: 1.0000 | Freq: Once | ORAL | 0 refills | Status: AC

## 2021-12-13 NOTE — Telephone Encounter (Signed)
Gastroenterology Pre-Procedure Review  Request Date: 01/12/22 Requesting Physician: Dr. Marius Ditch  PATIENT REVIEW QUESTIONS: The patient responded to the following health history questions as indicated:    1. Are you having any GI issues? no 2. Do you have a personal history of Polyps? yes (patient stated at least 15 years ago) 3. Do you have a family history of Colon Cancer or Polyps? yes (father colon polyps) 4. Diabetes Mellitus? no 5. Joint replacements in the past 12 months?no 6. Major health problems in the past 3 months?no 7. Any artificial heart valves, MVP, or defibrillator?no    MEDICATIONS & ALLERGIES:    Patient reports the following regarding taking any anticoagulation/antiplatelet therapy:   Plavix, Coumadin, Eliquis, Xarelto, Lovenox, Pradaxa, Brilinta, or Effient? no Aspirin? no  Patient confirms/reports the following medications:  Current Outpatient Medications  Medication Sig Dispense Refill   albuterol (VENTOLIN HFA) 108 (90 Base) MCG/ACT inhaler SMARTSIG:1 Puff(s) By Mouth Every 4-6 Hours PRN     amLODipine (NORVASC) 10 MG tablet Take 10 mg by mouth daily.     atorvastatin (LIPITOR) 10 MG tablet Take 10 mg by mouth daily.     chlorhexidine (PERIDEX) 0.12 % solution Use as directed 15 mLs in the mouth or throat 2 (two) times daily. Swish and spit 120 mL 0   hydrochlorothiazide (HYDRODIURIL) 25 MG tablet Take 25 mg by mouth daily.     lidocaine (XYLOCAINE) 2 % solution Use as directed 10 mLs in the mouth or throat every 4 (four) hours as needed for mouth pain. Swish, gargle, and spit 200 mL 0   losartan (COZAAR) 25 MG tablet Take 25 mg by mouth daily.     naproxen (NAPROSYN) 500 MG tablet Take 1 tablet (500 mg total) by mouth 2 (two) times daily with a meal. 20 tablet 00   oxyCODONE-acetaminophen (PERCOCET) 10-325 MG tablet Take 1 tablet by mouth 4 (four) times daily as needed.     sulfamethoxazole-trimethoprim (BACTRIM DS,SEPTRA DS) 800-160 MG tablet Take 1 tablet by  mouth 2 (two) times daily. 20 tablet 0   traMADol (ULTRAM) 50 MG tablet Take 1 tablet (50 mg total) by mouth every 12 (twelve) hours as needed. 12 tablet 0   No current facility-administered medications for this visit.    Patient confirms/reports the following allergies:  Allergies  Allergen Reactions   Hydrocodone Anaphylaxis   Tramadol Hives    No orders of the defined types were placed in this encounter.   AUTHORIZATION INFORMATION Primary Insurance: 1D#: Group #:  Secondary Insurance: 1D#: Group #:  SCHEDULE INFORMATION: Date: 01/12/22 Time: Location: [facility]

## 2021-12-18 ENCOUNTER — Ambulatory Visit: Payer: 59 | Admitting: Nurse Practitioner

## 2022-01-01 DIAGNOSIS — M5416 Radiculopathy, lumbar region: Secondary | ICD-10-CM | POA: Diagnosis not present

## 2022-01-01 DIAGNOSIS — M47816 Spondylosis without myelopathy or radiculopathy, lumbar region: Secondary | ICD-10-CM | POA: Diagnosis not present

## 2022-01-08 DIAGNOSIS — H60502 Unspecified acute noninfective otitis externa, left ear: Secondary | ICD-10-CM | POA: Diagnosis not present

## 2022-01-08 DIAGNOSIS — Z6841 Body Mass Index (BMI) 40.0 and over, adult: Secondary | ICD-10-CM | POA: Diagnosis not present

## 2022-01-10 ENCOUNTER — Telehealth: Payer: Self-pay

## 2022-01-10 DIAGNOSIS — Z1211 Encounter for screening for malignant neoplasm of colon: Secondary | ICD-10-CM

## 2022-01-10 NOTE — Telephone Encounter (Signed)
Patient lvm to reschedule her 01/12/22 ([place]).  LVM for pt to return my call to reschedule.  Thanks,  [place], [place]

## 2022-01-11 NOTE — Telephone Encounter (Signed)
Patients colonoscopy has been rescheduled to 01/29/22 with Dr. Marius Ditch at [facility] still.  This was due to insurance change.  She has been given Lisa's phone number to update.  Thanks,  [place], [place]

## 2022-01-29 ENCOUNTER — Ambulatory Visit: Payer: 59 | Admitting: Registered Nurse

## 2022-01-29 ENCOUNTER — Ambulatory Visit
Admission: RE | Admit: 2022-01-29 | Discharge: 2022-01-29 | Disposition: A | Discharge status: Home / Self Care | Payer: 59 | Source: Ambulatory Visit | Attending: Gastroenterology | Admitting: Gastroenterology

## 2022-01-29 ENCOUNTER — Encounter
Admission: RE | Disposition: A | Discharge status: Home / Self Care | Payer: Self-pay | Source: Ambulatory Visit | Attending: Gastroenterology

## 2022-01-29 ENCOUNTER — Encounter: Payer: Self-pay | Admitting: Gastroenterology

## 2022-01-29 DIAGNOSIS — I10 Essential (primary) hypertension: Secondary | ICD-10-CM | POA: Insufficient documentation

## 2022-01-29 DIAGNOSIS — Z0000 Encounter for general adult medical examination without abnormal findings: Secondary | ICD-10-CM

## 2022-01-29 DIAGNOSIS — Z1211 Encounter for screening for malignant neoplasm of colon: Secondary | ICD-10-CM | POA: Insufficient documentation

## 2022-01-29 DIAGNOSIS — F419 Anxiety disorder, unspecified: Secondary | ICD-10-CM | POA: Diagnosis not present

## 2022-01-29 DIAGNOSIS — D122 Benign neoplasm of ascending colon: Secondary | ICD-10-CM | POA: Diagnosis not present

## 2022-01-29 DIAGNOSIS — K644 Residual hemorrhoidal skin tags: Secondary | ICD-10-CM | POA: Insufficient documentation

## 2022-01-29 DIAGNOSIS — Z6841 Body Mass Index (BMI) 40.0 and over, adult: Secondary | ICD-10-CM | POA: Insufficient documentation

## 2022-01-29 DIAGNOSIS — K635 Polyp of colon: Secondary | ICD-10-CM | POA: Diagnosis not present

## 2022-01-29 DIAGNOSIS — F17210 Nicotine dependence, cigarettes, uncomplicated: Secondary | ICD-10-CM | POA: Insufficient documentation

## 2022-01-29 DIAGNOSIS — Z1239 Encounter for other screening for malignant neoplasm of breast: Secondary | ICD-10-CM

## 2022-01-29 DIAGNOSIS — Z87891 Personal history of nicotine dependence: Secondary | ICD-10-CM | POA: Diagnosis not present

## 2022-01-29 DIAGNOSIS — R69 Illness, unspecified: Secondary | ICD-10-CM | POA: Diagnosis not present

## 2022-01-29 DIAGNOSIS — F32A Depression, unspecified: Secondary | ICD-10-CM | POA: Diagnosis not present

## 2022-01-29 HISTORY — PX: COLONOSCOPY WITH PROPOFOL: SHX5780

## 2022-01-29 SURGERY — COLONOSCOPY WITH PROPOFOL
Anesthesia: General

## 2022-01-29 MED ORDER — PROPOFOL 500 MG/50ML IV EMUL
INTRAVENOUS | Status: DC | PRN
  Administered 2022-01-29: 150 ug/kg/min via INTRAVENOUS

## 2022-01-29 MED ORDER — DEXMEDETOMIDINE HCL IN NACL 200 MCG/50ML IV SOLN
INTRAVENOUS | Status: DC | PRN
  Administered 2022-01-29: 8 ug via INTRAVENOUS

## 2022-01-29 MED ORDER — PROPOFOL 10 MG/ML IV BOLUS
INTRAVENOUS | Status: DC | PRN
  Administered 2022-01-29: 80 mg via INTRAVENOUS
  Administered 2022-01-29: 20 mg via INTRAVENOUS

## 2022-01-29 MED ORDER — SODIUM CHLORIDE 0.9 % IV SOLN: INTRAVENOUS | Status: DC

## 2022-01-29 MED ORDER — LIDOCAINE HCL (CARDIAC) PF 100 MG/5ML IV SOSY
PREFILLED_SYRINGE | INTRAVENOUS | Status: DC | PRN
  Administered 2022-01-29: 40 mg via INTRAVENOUS

## 2022-01-29 NOTE — Anesthesia Preprocedure Evaluation (Signed)
Anesthesia Evaluation  Patient identified by MRN, date of birth, ID band Patient awake    Reviewed: Allergy & Precautions, H&P , NPO status , Patient's Chart, lab work & pertinent test results, reviewed documented beta blocker date and time   History of Anesthesia Complications Negative for: history of anesthetic complications  Airway Mallampati: III  TM Distance: >3 FB Neck ROM: full    Dental  (+) Dental Advidsory Given, Missing, Poor Dentition   Pulmonary neg shortness of breath, neg COPD, neg recent URI, Current Smoker   Pulmonary exam normal breath sounds clear to auscultation       Cardiovascular Exercise Tolerance: Good hypertension, (-) angina (-) Past MI and (-) Cardiac Stents negative cardio ROS Normal cardiovascular exam(-) dysrhythmias (-) Valvular Problems/Murmurs Rhythm:regular Rate:Normal     Neuro/Psych  PSYCHIATRIC DISORDERS Anxiety Depression    negative neurological ROS     GI/Hepatic Neg liver ROS,GERD  ,,  Endo/Other  neg diabetes  Morbid obesity  Renal/GU negative Renal ROS  negative genitourinary   Musculoskeletal   Abdominal   Peds  Hematology negative hematology ROS (+)   Anesthesia Other Findings Past Medical History: No date: Anxiety No date: Depression No date: Hypertension   Reproductive/Obstetrics negative OB ROS                             Anesthesia Physical Anesthesia Plan  ASA: 3  Anesthesia Plan: General   Post-op Pain Management:    Induction: Intravenous  PONV Risk Score and Plan: 2 and Propofol infusion and TIVA  Airway Management Planned: Natural Airway and Nasal Cannula  Additional Equipment:   Intra-op Plan:   Post-operative Plan:   Informed Consent: I have reviewed the patients History and Physical, chart, labs and discussed the procedure including the risks, benefits and alternatives for the proposed anesthesia with the patient  or authorized representative who has indicated his/her understanding and acceptance.     Dental Advisory Given  Plan Discussed with: Anesthesiologist, CRNA and Surgeon  Anesthesia Plan Comments:        Anesthesia Quick Evaluation

## 2022-01-29 NOTE — Transfer of Care (Signed)
Immediate Anesthesia Transfer of Care Note  Patient: [name]  Procedure(s) Performed: COLONOSCOPY WITH PROPOFOL  Patient Location: Endo    Anesthesia Type:GEN  Level of Consciousness: Drowsy  Airway & Oxygen Therapy: Spontaneous Post-op Assessment: Stable  Post vital signs: stable  Last Vitals:  Vitals Value Taken Time  BP    Temp    Pulse    Resp    SpO2      Last Pain:  Vitals:   01/29/22 1119  TempSrc: Temporal  PainSc: 0-No pain         Complications: No notable events documented.

## 2022-01-29 NOTE — Anesthesia Postprocedure Evaluation (Signed)
Anesthesia Post Note  Patient: [name]  Procedure(s) Performed: COLONOSCOPY WITH PROPOFOL  Patient location during evaluation: Endoscopy Anesthesia Type: General Level of consciousness: awake and alert Pain management: pain level controlled Vital Signs Assessment: post-procedure vital signs reviewed and stable Respiratory status: spontaneous breathing, nonlabored ventilation, respiratory function stable and patient connected to nasal cannula oxygen Cardiovascular status: blood pressure returned to baseline and stable Postop Assessment: no apparent nausea or vomiting Anesthetic complications: no   No notable events documented.   Last Vitals:  Vitals:   01/29/22 1229 01/29/22 1239  BP: 136/81 102/64  Pulse: 71 76  Resp: 15 18  Temp:    SpO2: 96% 96%    Last Pain:  Vitals:   01/29/22 1229  TempSrc:   PainSc: 0-No pain                 Martha Clan

## 2022-01-29 NOTE — H&P (Signed)
Cephas Darby, MD [address]  [place]  [place], [place] [address]  Main: [phone]  Fax: [phone] Pager: [phone]  Primary Care Physician:  Mardene Speak, PA-C Primary Gastroenterologist:  Dr. Cephas Darby  Pre-Procedure History & Physical: HPI:  [name] is a 56 y.o. female is here for an colonoscopy.   Past Medical History:  Diagnosis Date   Anxiety    Depression    Hypertension     Past Surgical History:  Procedure Laterality Date   ABDOMINAL HYSTERECTOMY     APPENDECTOMY     CESAREAN SECTION     X2   CHOLECYSTECTOMY      Prior to Admission medications   Medication Sig Start Date End Date Taking? Authorizing Provider  albuterol (VENTOLIN HFA) 108 (90 Base) MCG/ACT inhaler SMARTSIG:1 Puff(s) By Mouth Every 4-6 Hours PRN 10/25/21   [provider]  amLODipine (NORVASC) 10 MG tablet Take 10 mg by mouth daily. 11/23/21   [provider]  atorvastatin (LIPITOR) 10 MG tablet Take 10 mg by mouth daily. 10/25/21   [provider]  chlorhexidine (PERIDEX) 0.12 % solution Use as directed 15 mLs in the mouth or throat 2 (two) times daily. Swish and spit 05/30/21   Cuthriell, Charline Bills, PA-C  hydrochlorothiazide (HYDRODIURIL) 25 MG tablet Take 25 mg by mouth daily. 11/23/21   [provider]  lidocaine (XYLOCAINE) 2 % solution Use as directed 10 mLs in the mouth or throat every 4 (four) hours as needed for mouth pain. Swish, gargle, and spit 05/30/21   Cuthriell, Charline Bills, PA-C  losartan (COZAAR) 25 MG tablet Take 25 mg by mouth daily. 11/23/21   [provider]  naproxen (NAPROSYN) 500 MG tablet Take 1 tablet (500 mg total) by mouth 2 (two) times daily with a meal. 12/28/17   Sable Feil, PA-C  oxyCODONE-acetaminophen (PERCOCET) 10-325 MG tablet Take 1 tablet by mouth 4 (four) times daily as needed. 11/20/21   [provider]  sulfamethoxazole-trimethoprim (BACTRIM DS,SEPTRA DS) 800-160 MG  tablet Take 1 tablet by mouth 2 (two) times daily. 12/28/17   Sable Feil, PA-C  traMADol (ULTRAM) 50 MG tablet Take 1 tablet (50 mg total) by mouth every 12 (twelve) hours as needed. 12/28/17   Sable Feil, PA-C    Allergies as of 12/13/2021 - Review Complete 05/30/2021  Allergen Reaction Noted   Hydrocodone Anaphylaxis 05/30/2021   Tramadol Hives 03/15/2017    History reviewed. No pertinent family history.  Social History   Socioeconomic History   Marital status: Married    Spouse name: Not on file   Number of children: Not on file   Years of education: Not on file   Highest education level: Not on file  Occupational History   Not on file  Tobacco Use   Smoking status: Every Day    Types: Cigarettes   Smokeless tobacco: Never  Vaping Use   Vaping Use: Never used  Substance and Sexual Activity   Alcohol use: Yes   Drug use: No   Sexual activity: Not on file  Other Topics Concern   Not on file  Social History Narrative   Not on file   Social Determinants of Health   Financial Resource Strain: Not on file  Food Insecurity: Not on file  Transportation Needs: Not on file  Physical Activity: Not on file  Stress: Not on file  Social Connections: Not on file  Intimate Partner Violence: Not on file  Review of Systems: See HPI, otherwise negative ROS  Physical Exam: BP 129/87   Pulse 75   Temp (!) 96.2 F (35.7 C) (Temporal)   Resp 20   Ht '5\' 2"'$  (1.575 m)   Wt 103.6 kg   SpO2 98%   BMI 41.79 kg/m  General:   Alert,  pleasant and cooperative in NAD Head:  Normocephalic and atraumatic. Neck:  Supple; no masses or thyromegaly. Lungs:  Clear throughout to auscultation.    Heart:  Regular rate and rhythm. Abdomen:  Soft, nontender and nondistended. Normal bowel sounds, without guarding, and without rebound.   Neurologic:  Alert and  oriented x4;  grossly normal neurologically.  Impression/Plan: [name] is here for an colonoscopy to be  performed for colon cancer screening  Risks, benefits, limitations, and alternatives regarding  colonoscopy have been reviewed with the patient.  Questions have been answered.  All parties agreeable.   Sherri Sear, MD  01/29/2022, 11:49 AM

## 2022-01-29 NOTE — Anesthesia Procedure Notes (Signed)
Date/Time: 01/29/2022 11:57 AM  Performed by: Doreen Salvage, CRNAPre-anesthesia Checklist: Patient identified, Emergency Drugs available, Suction available and Patient being monitored Patient Re-evaluated:Patient Re-evaluated prior to induction Oxygen Delivery Method: Nasal cannula Induction Type: IV induction Dental Injury: Teeth and Oropharynx as per pre-operative assessment  Comments: Nasal cannula with etCO2 monitoring

## 2022-01-29 NOTE — Op Note (Signed)
[facility] Gastroenterology Patient Name: [name] Procedure Date: 01/29/2022 11:48 AM MRN: [id number] Account #: [id number] Date of Birth: Apr 08, 1965 Admit Type: Outpatient Age: 56 Room: [facility] ENDO ROOM 4 Gender: Female Note Status: Finalized Instrument Name: Jasper Riling [id number] Procedure:             Colonoscopy Indications:           Screening for colorectal malignant neoplasm Providers:             Lin Landsman MD, MD Referring MD:          Elberta Leatherwood Medicines:             General Anesthesia Complications:         No immediate complications. Estimated blood loss: None. Procedure:             Pre-Anesthesia Assessment:                        - Prior to the procedure, a History and Physical was                         performed, and patient medications and allergies were                         reviewed. The patient is competent. The risks and                         benefits of the procedure and the sedation options and                         risks were discussed with the patient. All questions                         were answered and informed consent was obtained.                         Patient identification and proposed procedure were                         verified by the physician, the nurse, the                         anesthesiologist, the anesthetist and the technician                         in the pre-procedure area. Mental Status Examination:                         alert and oriented. Airway Examination: normal                         oropharyngeal airway and neck mobility. Respiratory                         Examination: clear to auscultation. CV Examination:                         normal. Prophylactic Antibiotics: The patient does not  require prophylactic antibiotics. Prior                         Anticoagulants: The patient has taken no anticoagulant                         or antiplatelet  agents. ASA Grade Assessment: III - A                         patient with severe systemic disease. After reviewing                         the risks and benefits, the patient was deemed in                         satisfactory condition to undergo the procedure. The                         anesthesia plan was to use general anesthesia.                         Immediately prior to administration of medications,                         the patient was re-assessed for adequacy to receive                         sedatives. The heart rate, respiratory rate, oxygen                         saturations, blood pressure, adequacy of pulmonary                         ventilation, and response to care were monitored                         throughout the procedure. The physical status of the                         patient was re-assessed after the procedure.                        After obtaining informed consent, the colonoscope was                         passed under direct vision. Throughout the procedure,                         the patient's blood pressure, pulse, and oxygen                         saturations were monitored continuously. The                         Colonoscope was introduced through the anus and                         advanced to the the cecum, identified by appendiceal  orifice and ileocecal valve. The colonoscopy was                         performed without difficulty. The patient tolerated                         the procedure well. The quality of the bowel                         preparation was evaluated using the BBPS [facility] Bowel                         Preparation Scale) with scores of: Right Colon = 3,                         Transverse Colon = 3 and Left Colon = 3 (entire mucosa                         seen well with no residual staining, small fragments                         of stool or opaque liquid). The total BBPS score                          equals 9. The ileocecal valve, appendiceal orifice,                         and rectum were photographed. Findings:      The perianal and digital rectal examinations were normal. Pertinent       negatives include normal sphincter tone and no palpable rectal lesions.      A 5 mm polyp was found in the ascending colon. The polyp was sessile.       The polyp was removed with a cold snare. Resection and retrieval were       complete. Estimated blood loss: none.      Non-bleeding external hemorrhoids were found during retroflexion. The       hemorrhoids were medium-sized.      Skin tags were found on perianal exam. Impression:            - One 5 mm polyp in the ascending colon, removed with                         a cold snare. Resected and retrieved.                        - Non-bleeding external hemorrhoids.                        - Perianal skin tags found on perianal exam. Recommendation:        - Discharge patient to home (with escort).                        - Resume previous diet today.                        - Continue present medications.                        -  Await pathology results.                        - Repeat colonoscopy in 5 to 7 years for surveillance                         based on pathology results. Procedure Code(s):     --- Professional ---                        [phone], Colonoscopy, flexible; with removal of                         tumor(s), polyp(s), or other lesion(s) by snare                         technique Diagnosis Code(s):     --- Professional ---                        K64.4, Residual hemorrhoidal skin tags                        Z12.11, Encounter for screening for malignant neoplasm                         of colon                        D12.2, Benign neoplasm of ascending colon CPT copyright 2022 American Medical Association. All rights reserved. The codes documented in this report are preliminary and upon coder review may  be revised to meet  current compliance requirements. Dr. Ulyess Mort Lin Landsman MD, MD 01/29/2022 12:16:04 PM This report has been signed electronically. Number of Addenda: 0 Note Initiated On: 01/29/2022 11:48 AM Scope Withdrawal Time: 0 hours 9 minutes 33 seconds  Total Procedure Duration: 0 hours 13 minutes 12 seconds  Estimated Blood Loss:  Estimated blood loss: none.      [facility]

## 2022-01-30 ENCOUNTER — Encounter: Payer: Self-pay | Admitting: Gastroenterology

## 2022-01-30 LAB — SURGICAL PATHOLOGY

## 2022-01-31 ENCOUNTER — Telehealth: Payer: Self-pay | Admitting: Physician Assistant

## 2022-01-31 DIAGNOSIS — M47816 Spondylosis without myelopathy or radiculopathy, lumbar region: Secondary | ICD-10-CM | POA: Diagnosis not present

## 2022-01-31 DIAGNOSIS — M25562 Pain in left knee: Secondary | ICD-10-CM | POA: Diagnosis not present

## 2022-01-31 DIAGNOSIS — M25561 Pain in right knee: Secondary | ICD-10-CM | POA: Diagnosis not present

## 2022-01-31 DIAGNOSIS — M5416 Radiculopathy, lumbar region: Secondary | ICD-10-CM | POA: Diagnosis not present

## 2022-01-31 DIAGNOSIS — Z79891 Long term (current) use of opiate analgesic: Secondary | ICD-10-CM | POA: Diagnosis not present

## 2022-01-31 NOTE — Telephone Encounter (Signed)
Copied from [place] [id number]. Topic: Quick Communication - Lab Results (Clinic Use ONLY) >> Jan 31, 2022  4:43 PM Joseline R wrote: Called patient to inform them of pathology lab results. When patient returns call, triage nurse may disclose results.

## 2022-01-31 NOTE — Telephone Encounter (Signed)
Patient called, left VM to return the call to the office for lab results. See result notes.

## 2022-01-31 NOTE — Progress Notes (Signed)
Please, let pt know that her surgical pathology reports showed multiple fragments of tubular adenoma with no evidence of high-grade dysplasia or malignancy. Recommendation for follow-up interval will be sent separately/ 5-7 years.

## 2022-02-28 DIAGNOSIS — F17210 Nicotine dependence, cigarettes, uncomplicated: Secondary | ICD-10-CM | POA: Diagnosis not present

## 2022-02-28 DIAGNOSIS — I10 Essential (primary) hypertension: Secondary | ICD-10-CM | POA: Diagnosis not present

## 2022-02-28 DIAGNOSIS — Z1211 Encounter for screening for malignant neoplasm of colon: Secondary | ICD-10-CM | POA: Diagnosis not present

## 2022-02-28 DIAGNOSIS — M5416 Radiculopathy, lumbar region: Secondary | ICD-10-CM | POA: Diagnosis not present

## 2022-02-28 DIAGNOSIS — M47816 Spondylosis without myelopathy or radiculopathy, lumbar region: Secondary | ICD-10-CM | POA: Diagnosis not present

## 2022-02-28 DIAGNOSIS — Z79891 Long term (current) use of opiate analgesic: Secondary | ICD-10-CM | POA: Diagnosis not present

## 2022-02-28 DIAGNOSIS — J449 Chronic obstructive pulmonary disease, unspecified: Secondary | ICD-10-CM | POA: Diagnosis not present

## 2022-02-28 DIAGNOSIS — M25561 Pain in right knee: Secondary | ICD-10-CM | POA: Diagnosis not present

## 2022-02-28 DIAGNOSIS — M25562 Pain in left knee: Secondary | ICD-10-CM | POA: Diagnosis not present

## 2022-03-06 ENCOUNTER — Ambulatory Visit
Admission: RE | Admit: 2022-03-06 | Discharge: 2022-03-06 | Disposition: A | Discharge status: Home / Self Care | Payer: Medicaid Other | Source: Ambulatory Visit | Attending: Nurse Practitioner | Admitting: Nurse Practitioner

## 2022-03-06 ENCOUNTER — Other Ambulatory Visit: Payer: Self-pay | Admitting: Nurse Practitioner

## 2022-03-06 ENCOUNTER — Ambulatory Visit
Admission: RE | Admit: 2022-03-06 | Discharge: 2022-03-06 | Disposition: A | Discharge status: Home / Self Care | Payer: Medicaid Other | Attending: Nurse Practitioner | Admitting: Nurse Practitioner

## 2022-03-06 ENCOUNTER — Encounter: Payer: Self-pay | Admitting: Nurse Practitioner

## 2022-03-06 DIAGNOSIS — I10 Essential (primary) hypertension: Secondary | ICD-10-CM | POA: Diagnosis not present

## 2022-03-06 DIAGNOSIS — M25562 Pain in left knee: Secondary | ICD-10-CM

## 2022-03-06 DIAGNOSIS — M25561 Pain in right knee: Secondary | ICD-10-CM | POA: Insufficient documentation

## 2022-03-06 DIAGNOSIS — K219 Gastro-esophageal reflux disease without esophagitis: Secondary | ICD-10-CM | POA: Diagnosis not present

## 2022-03-06 DIAGNOSIS — Z833 Family history of diabetes mellitus: Secondary | ICD-10-CM | POA: Diagnosis not present

## 2022-03-06 DIAGNOSIS — M1711 Unilateral primary osteoarthritis, right knee: Secondary | ICD-10-CM | POA: Diagnosis not present

## 2022-03-13 DIAGNOSIS — M545 Low back pain, unspecified: Secondary | ICD-10-CM | POA: Diagnosis not present

## 2022-03-15 DIAGNOSIS — M545 Low back pain, unspecified: Secondary | ICD-10-CM | POA: Diagnosis not present

## 2022-03-28 DIAGNOSIS — M5416 Radiculopathy, lumbar region: Secondary | ICD-10-CM | POA: Diagnosis not present

## 2022-03-28 DIAGNOSIS — Z79891 Long term (current) use of opiate analgesic: Secondary | ICD-10-CM | POA: Diagnosis not present

## 2022-03-28 DIAGNOSIS — M25561 Pain in right knee: Secondary | ICD-10-CM | POA: Diagnosis not present

## 2022-03-28 DIAGNOSIS — M25562 Pain in left knee: Secondary | ICD-10-CM | POA: Diagnosis not present

## 2022-03-28 DIAGNOSIS — M47816 Spondylosis without myelopathy or radiculopathy, lumbar region: Secondary | ICD-10-CM | POA: Diagnosis not present

## 2022-04-02 ENCOUNTER — Other Ambulatory Visit: Payer: Self-pay | Admitting: Nurse Practitioner

## 2022-04-02 DIAGNOSIS — M5416 Radiculopathy, lumbar region: Secondary | ICD-10-CM

## 2022-04-05 ENCOUNTER — Ambulatory Visit
Admission: RE | Admit: 2022-04-05 | Discharge: 2022-04-05 | Disposition: A | Discharge status: Home / Self Care | Payer: 59 | Source: Ambulatory Visit | Attending: Nurse Practitioner | Admitting: Nurse Practitioner

## 2022-04-05 DIAGNOSIS — M5416 Radiculopathy, lumbar region: Secondary | ICD-10-CM | POA: Diagnosis present

## 2022-04-25 DIAGNOSIS — M25561 Pain in right knee: Secondary | ICD-10-CM | POA: Diagnosis not present

## 2022-04-25 DIAGNOSIS — M170 Bilateral primary osteoarthritis of knee: Secondary | ICD-10-CM | POA: Diagnosis not present

## 2022-04-25 DIAGNOSIS — M25562 Pain in left knee: Secondary | ICD-10-CM | POA: Diagnosis not present

## 2022-04-25 DIAGNOSIS — M545 Low back pain, unspecified: Secondary | ICD-10-CM | POA: Diagnosis not present

## 2022-04-25 DIAGNOSIS — Z79891 Long term (current) use of opiate analgesic: Secondary | ICD-10-CM | POA: Diagnosis not present

## 2022-04-25 DIAGNOSIS — M47816 Spondylosis without myelopathy or radiculopathy, lumbar region: Secondary | ICD-10-CM | POA: Diagnosis not present

## 2022-05-23 DIAGNOSIS — M170 Bilateral primary osteoarthritis of knee: Secondary | ICD-10-CM | POA: Diagnosis not present

## 2022-05-23 DIAGNOSIS — Z79891 Long term (current) use of opiate analgesic: Secondary | ICD-10-CM | POA: Diagnosis not present

## 2022-05-23 DIAGNOSIS — M545 Low back pain, unspecified: Secondary | ICD-10-CM | POA: Diagnosis not present

## 2022-05-23 DIAGNOSIS — M25561 Pain in right knee: Secondary | ICD-10-CM | POA: Diagnosis not present

## 2022-05-23 DIAGNOSIS — M47816 Spondylosis without myelopathy or radiculopathy, lumbar region: Secondary | ICD-10-CM | POA: Diagnosis not present

## 2022-05-23 DIAGNOSIS — M25562 Pain in left knee: Secondary | ICD-10-CM | POA: Diagnosis not present

## 2022-06-19 DIAGNOSIS — Z79891 Long term (current) use of opiate analgesic: Secondary | ICD-10-CM | POA: Diagnosis not present

## 2022-06-20 DIAGNOSIS — Z79891 Long term (current) use of opiate analgesic: Secondary | ICD-10-CM | POA: Diagnosis not present

## 2022-06-20 DIAGNOSIS — M170 Bilateral primary osteoarthritis of knee: Secondary | ICD-10-CM | POA: Diagnosis not present

## 2022-06-20 DIAGNOSIS — M25561 Pain in right knee: Secondary | ICD-10-CM | POA: Diagnosis not present

## 2022-06-20 DIAGNOSIS — G894 Chronic pain syndrome: Secondary | ICD-10-CM | POA: Diagnosis not present

## 2022-06-20 DIAGNOSIS — M545 Low back pain, unspecified: Secondary | ICD-10-CM | POA: Diagnosis not present

## 2022-06-20 DIAGNOSIS — M47816 Spondylosis without myelopathy or radiculopathy, lumbar region: Secondary | ICD-10-CM | POA: Diagnosis not present

## 2022-06-20 DIAGNOSIS — M25562 Pain in left knee: Secondary | ICD-10-CM | POA: Diagnosis not present

## 2022-06-25 DIAGNOSIS — Z1231 Encounter for screening mammogram for malignant neoplasm of breast: Secondary | ICD-10-CM | POA: Diagnosis not present

## 2022-07-06 DIAGNOSIS — M24812 Other specific joint derangements of left shoulder, not elsewhere classified: Secondary | ICD-10-CM | POA: Diagnosis not present

## 2022-07-06 DIAGNOSIS — M1711 Unilateral primary osteoarthritis, right knee: Secondary | ICD-10-CM | POA: Diagnosis not present

## 2022-07-18 DIAGNOSIS — M25512 Pain in left shoulder: Secondary | ICD-10-CM | POA: Diagnosis not present

## 2022-07-18 DIAGNOSIS — M25561 Pain in right knee: Secondary | ICD-10-CM | POA: Diagnosis not present

## 2022-07-18 DIAGNOSIS — M25562 Pain in left knee: Secondary | ICD-10-CM | POA: Diagnosis not present

## 2022-07-18 DIAGNOSIS — Z79891 Long term (current) use of opiate analgesic: Secondary | ICD-10-CM | POA: Diagnosis not present

## 2022-07-18 DIAGNOSIS — M47816 Spondylosis without myelopathy or radiculopathy, lumbar region: Secondary | ICD-10-CM | POA: Diagnosis not present

## 2022-07-18 DIAGNOSIS — M170 Bilateral primary osteoarthritis of knee: Secondary | ICD-10-CM | POA: Diagnosis not present

## 2022-07-18 DIAGNOSIS — G894 Chronic pain syndrome: Secondary | ICD-10-CM | POA: Diagnosis not present

## 2022-07-18 DIAGNOSIS — M545 Low back pain, unspecified: Secondary | ICD-10-CM | POA: Diagnosis not present

## 2022-07-26 DIAGNOSIS — M47816 Spondylosis without myelopathy or radiculopathy, lumbar region: Secondary | ICD-10-CM | POA: Diagnosis not present

## 2022-08-09 DIAGNOSIS — M47816 Spondylosis without myelopathy or radiculopathy, lumbar region: Secondary | ICD-10-CM | POA: Diagnosis not present

## 2022-08-09 DIAGNOSIS — G8929 Other chronic pain: Secondary | ICD-10-CM | POA: Diagnosis not present

## 2022-08-15 DIAGNOSIS — G894 Chronic pain syndrome: Secondary | ICD-10-CM | POA: Diagnosis not present

## 2022-08-15 DIAGNOSIS — Z79891 Long term (current) use of opiate analgesic: Secondary | ICD-10-CM | POA: Diagnosis not present

## 2022-08-15 DIAGNOSIS — M47816 Spondylosis without myelopathy or radiculopathy, lumbar region: Secondary | ICD-10-CM | POA: Diagnosis not present

## 2022-08-15 DIAGNOSIS — M170 Bilateral primary osteoarthritis of knee: Secondary | ICD-10-CM | POA: Diagnosis not present

## 2022-08-15 DIAGNOSIS — M25561 Pain in right knee: Secondary | ICD-10-CM | POA: Diagnosis not present

## 2022-08-15 DIAGNOSIS — M25562 Pain in left knee: Secondary | ICD-10-CM | POA: Diagnosis not present

## 2022-08-15 DIAGNOSIS — M545 Low back pain, unspecified: Secondary | ICD-10-CM | POA: Diagnosis not present

## 2022-08-15 DIAGNOSIS — M25512 Pain in left shoulder: Secondary | ICD-10-CM | POA: Diagnosis not present

## 2022-09-03 DIAGNOSIS — M47816 Spondylosis without myelopathy or radiculopathy, lumbar region: Secondary | ICD-10-CM | POA: Diagnosis not present

## 2022-09-25 DIAGNOSIS — M79672 Pain in left foot: Secondary | ICD-10-CM | POA: Diagnosis not present

## 2022-09-25 DIAGNOSIS — Z79891 Long term (current) use of opiate analgesic: Secondary | ICD-10-CM | POA: Diagnosis not present

## 2022-09-25 DIAGNOSIS — M25561 Pain in right knee: Secondary | ICD-10-CM | POA: Diagnosis not present

## 2022-09-25 DIAGNOSIS — M25562 Pain in left knee: Secondary | ICD-10-CM | POA: Diagnosis not present

## 2022-09-25 DIAGNOSIS — M170 Bilateral primary osteoarthritis of knee: Secondary | ICD-10-CM | POA: Diagnosis not present

## 2022-09-25 DIAGNOSIS — G894 Chronic pain syndrome: Secondary | ICD-10-CM | POA: Diagnosis not present

## 2022-09-25 DIAGNOSIS — M47816 Spondylosis without myelopathy or radiculopathy, lumbar region: Secondary | ICD-10-CM | POA: Diagnosis not present

## 2022-09-25 DIAGNOSIS — M79671 Pain in right foot: Secondary | ICD-10-CM | POA: Diagnosis not present

## 2022-09-25 DIAGNOSIS — M545 Low back pain, unspecified: Secondary | ICD-10-CM | POA: Diagnosis not present

## 2022-10-23 DIAGNOSIS — Z79891 Long term (current) use of opiate analgesic: Secondary | ICD-10-CM | POA: Diagnosis not present

## 2022-10-23 DIAGNOSIS — M79672 Pain in left foot: Secondary | ICD-10-CM | POA: Diagnosis not present

## 2022-10-23 DIAGNOSIS — M79671 Pain in right foot: Secondary | ICD-10-CM | POA: Diagnosis not present

## 2022-10-23 DIAGNOSIS — M545 Low back pain, unspecified: Secondary | ICD-10-CM | POA: Diagnosis not present

## 2022-10-23 DIAGNOSIS — G894 Chronic pain syndrome: Secondary | ICD-10-CM | POA: Diagnosis not present

## 2022-10-23 DIAGNOSIS — M47816 Spondylosis without myelopathy or radiculopathy, lumbar region: Secondary | ICD-10-CM | POA: Diagnosis not present

## 2022-11-11 NOTE — Progress Notes (Deleted)
Established patient visit  Patient: [name]   DOB: Mar 24, 1965   57 y.o. Female  MRN: [id number] Visit Date: 11/12/2022  Today's healthcare provider: Debera Lat, PA-C   No chief complaint on file.  Subjective     Discussed the use of AI scribe software for clinical note transcription with the patient, who gave verbal consent to proceed.  History of Present Illness         Medications: Outpatient Medications Prior to Visit  Medication Sig   albuterol (VENTOLIN HFA) 108 (90 Base) MCG/ACT inhaler SMARTSIG:1 Puff(s) By Mouth Every 4-6 Hours PRN   amLODipine (NORVASC) 10 MG tablet Take 10 mg by mouth daily.   atorvastatin (LIPITOR) 10 MG tablet Take 10 mg by mouth daily.   chlorhexidine (PERIDEX) 0.12 % solution Use as directed 15 mLs in the mouth or throat 2 (two) times daily. Swish and spit   hydrochlorothiazide (HYDRODIURIL) 25 MG tablet Take 25 mg by mouth daily.   lidocaine (XYLOCAINE) 2 % solution Use as directed 10 mLs in the mouth or throat every 4 (four) hours as needed for mouth pain. Swish, gargle, and spit   losartan (COZAAR) 25 MG tablet Take 25 mg by mouth daily.   oxyCODONE-acetaminophen (PERCOCET) 10-325 MG tablet Take 1 tablet by mouth 4 (four) times daily as needed.   No facility-administered medications prior to visit.    Review of Systems  All other systems reviewed and are negative.  Except see HPI   {Insert previous labs (optional):23779} {See past labs  Heme  Chem  Endocrine  Serology  Results Review (optional):1}   Objective    There were no vitals taken for this visit. {Insert last BP/Wt (optional):23777}{See vitals history (optional):1}   Physical Exam Vitals reviewed.  Constitutional:      General: She is not in acute distress.    Appearance: Normal appearance. She is well-developed. She is not diaphoretic.  HENT:     Head: Normocephalic and atraumatic.  Eyes:     General: No scleral icterus.    Conjunctiva/sclera:  Conjunctivae normal.  Neck:     Thyroid: No thyromegaly.  Cardiovascular:     Rate and Rhythm: Normal rate and regular rhythm.     Pulses: Normal pulses.     Heart sounds: Normal heart sounds. No murmur heard. Pulmonary:     Effort: Pulmonary effort is normal. No respiratory distress.     Breath sounds: Normal breath sounds. No wheezing, rhonchi or rales.  Musculoskeletal:     Cervical back: Neck supple.     Right lower leg: No edema.     Left lower leg: No edema.  Lymphadenopathy:     Cervical: No cervical adenopathy.  Skin:    General: Skin is warm and dry.     Findings: No rash.  Neurological:     Mental Status: She is alert and oriented to person, place, and time. Mental status is at baseline.  Psychiatric:        Mood and Affect: Mood normal.        Behavior: Behavior normal.      No results found for any visits on 11/12/22.  Assessment & Plan          No follow-ups on file.     The patient was advised to call back or seek an in-person evaluation if the symptoms worsen or if the condition fails to improve as anticipated.  I discussed the assessment and treatment plan with the patient. The patient was  provided an opportunity to ask questions and all were answered. The patient agreed with the plan and demonstrated an understanding of the instructions.  I, Debera Lat, PA-C have reviewed all documentation for this visit. The documentation on  11/12/22  for the exam, diagnosis, procedures, and orders are all accurate and complete.  Debera Lat, [facility], MMS [facility] [phone] (phone) [phone] (fax)  [facility]

## 2022-11-12 ENCOUNTER — Ambulatory Visit: Payer: 59 | Admitting: Physician Assistant

## 2022-11-12 DIAGNOSIS — E782 Mixed hyperlipidemia: Secondary | ICD-10-CM

## 2022-11-12 DIAGNOSIS — I10 Essential (primary) hypertension: Secondary | ICD-10-CM

## 2022-11-15 DIAGNOSIS — M545 Low back pain, unspecified: Secondary | ICD-10-CM | POA: Diagnosis not present

## 2022-11-19 ENCOUNTER — Ambulatory Visit
Admission: RE | Admit: 2022-11-19 | Discharge: 2022-11-19 | Disposition: A | Discharge status: Home / Self Care | Payer: Medicaid Other | Source: Ambulatory Visit | Attending: Nurse Practitioner | Admitting: Nurse Practitioner

## 2022-11-19 ENCOUNTER — Ambulatory Visit
Admission: RE | Admit: 2022-11-19 | Discharge: 2022-11-19 | Disposition: A | Discharge status: Home / Self Care | Payer: Medicaid Other | Attending: Nurse Practitioner | Admitting: Nurse Practitioner

## 2022-11-19 ENCOUNTER — Other Ambulatory Visit: Payer: Self-pay | Admitting: Nurse Practitioner

## 2022-11-19 DIAGNOSIS — M545 Low back pain, unspecified: Secondary | ICD-10-CM | POA: Insufficient documentation

## 2022-11-21 DIAGNOSIS — Z79891 Long term (current) use of opiate analgesic: Secondary | ICD-10-CM | POA: Diagnosis not present

## 2022-11-21 DIAGNOSIS — M545 Low back pain, unspecified: Secondary | ICD-10-CM | POA: Diagnosis not present

## 2022-11-21 DIAGNOSIS — M79672 Pain in left foot: Secondary | ICD-10-CM | POA: Diagnosis not present

## 2022-11-21 DIAGNOSIS — M47816 Spondylosis without myelopathy or radiculopathy, lumbar region: Secondary | ICD-10-CM | POA: Diagnosis not present

## 2022-11-21 DIAGNOSIS — M79671 Pain in right foot: Secondary | ICD-10-CM | POA: Diagnosis not present

## 2022-11-21 DIAGNOSIS — G894 Chronic pain syndrome: Secondary | ICD-10-CM | POA: Diagnosis not present

## 2022-12-19 DIAGNOSIS — M545 Low back pain, unspecified: Secondary | ICD-10-CM | POA: Diagnosis not present

## 2022-12-19 DIAGNOSIS — F4310 Post-traumatic stress disorder, unspecified: Secondary | ICD-10-CM | POA: Diagnosis not present

## 2022-12-19 DIAGNOSIS — M47816 Spondylosis without myelopathy or radiculopathy, lumbar region: Secondary | ICD-10-CM | POA: Diagnosis not present

## 2022-12-19 DIAGNOSIS — G894 Chronic pain syndrome: Secondary | ICD-10-CM | POA: Diagnosis not present

## 2022-12-19 DIAGNOSIS — Z79891 Long term (current) use of opiate analgesic: Secondary | ICD-10-CM | POA: Diagnosis not present

## 2023-01-02 DIAGNOSIS — F4310 Post-traumatic stress disorder, unspecified: Secondary | ICD-10-CM | POA: Diagnosis not present

## 2023-01-16 DIAGNOSIS — Z79891 Long term (current) use of opiate analgesic: Secondary | ICD-10-CM | POA: Diagnosis not present

## 2023-01-16 DIAGNOSIS — M545 Low back pain, unspecified: Secondary | ICD-10-CM | POA: Diagnosis not present

## 2023-01-16 DIAGNOSIS — G894 Chronic pain syndrome: Secondary | ICD-10-CM | POA: Diagnosis not present

## 2023-01-16 DIAGNOSIS — M47816 Spondylosis without myelopathy or radiculopathy, lumbar region: Secondary | ICD-10-CM | POA: Diagnosis not present

## 2023-01-16 DIAGNOSIS — M533 Sacrococcygeal disorders, not elsewhere classified: Secondary | ICD-10-CM | POA: Diagnosis not present

## 2023-02-18 ENCOUNTER — Other Ambulatory Visit: Payer: Self-pay

## 2023-02-18 DIAGNOSIS — M533 Sacrococcygeal disorders, not elsewhere classified: Secondary | ICD-10-CM | POA: Insufficient documentation

## 2023-02-19 MED ORDER — METFORMIN HCL 500 MG PO TABS: 500.0000 mg | ORAL_TABLET | Freq: Two times a day (BID) | ORAL | 0 refills | Status: DC

## 2023-02-19 MED ORDER — ALBUTEROL SULFATE HFA 108 (90 BASE) MCG/ACT IN AERS: 1.0000 | INHALATION_SPRAY | RESPIRATORY_TRACT | 0 refills | Status: DC | PRN

## 2023-03-07 ENCOUNTER — Encounter: Payer: Self-pay | Admitting: Family Medicine

## 2023-03-07 ENCOUNTER — Ambulatory Visit (INDEPENDENT_AMBULATORY_CARE_PROVIDER_SITE_OTHER): Payer: Medicaid Other | Admitting: Family Medicine

## 2023-03-07 VITALS — BP 120/76 | HR 84 | Temp 97.7°F | Resp 18 | Ht 62.0 in | Wt 224.6 lb

## 2023-03-07 DIAGNOSIS — J42 Unspecified chronic bronchitis: Secondary | ICD-10-CM | POA: Diagnosis not present

## 2023-03-07 DIAGNOSIS — E782 Mixed hyperlipidemia: Secondary | ICD-10-CM

## 2023-03-07 DIAGNOSIS — I10 Essential (primary) hypertension: Secondary | ICD-10-CM

## 2023-03-07 DIAGNOSIS — E118 Type 2 diabetes mellitus with unspecified complications: Secondary | ICD-10-CM

## 2023-03-07 DIAGNOSIS — Z0000 Encounter for general adult medical examination without abnormal findings: Secondary | ICD-10-CM

## 2023-03-07 DIAGNOSIS — Z122 Encounter for screening for malignant neoplasm of respiratory organs: Secondary | ICD-10-CM

## 2023-03-07 DIAGNOSIS — F339 Major depressive disorder, recurrent, unspecified: Secondary | ICD-10-CM

## 2023-03-07 MED ORDER — AMLODIPINE BESYLATE 10 MG PO TABS: 10.0000 mg | ORAL_TABLET | Freq: Every day | ORAL | 11 refills | Status: DC

## 2023-03-07 MED ORDER — ALBUTEROL SULFATE (2.5 MG/3ML) 0.083% IN NEBU: 2.5000 mg | INHALATION_SOLUTION | RESPIRATORY_TRACT | 6 refills | Status: AC | PRN

## 2023-03-07 NOTE — Assessment & Plan Note (Signed)
PHQ-9-12.  Followed by psychiatry.  Recent increase in Wellbutrin to 450 mg daily.

## 2023-03-07 NOTE — Assessment & Plan Note (Signed)
Checking A1c and CMP.  She is on metformin 500 mg twice daily.

## 2023-03-07 NOTE — Assessment & Plan Note (Signed)
Good control of her hypertension with amlodipine 10 mg HCTZ 25 mg and losartan 25 mg

## 2023-03-07 NOTE — Progress Notes (Signed)
Established Patient Office Visit  Subjective   Patient ID: [name], female    DOB: 08-18-1965  Age: 58 y.o. MRN: [id number]  Chief Complaint  Patient presents with   Establish Care   Medical Management of Chronic Issues    HPI [name] is doing okay.  She [facility] to [facility].  Her back hurts all the time and she rates her pain as 7 out of 10 today.  She has been getting epidural steroid injections but they do not seem to be helping.  She also takes gabapentin And oxycodone 10-325 4 times daily. She is a borderline diabetic and takes metformin 500 mg twice daily.  No difficulty taking her medication Hypertension is well-controlled with amlodipine HCTZ 25 and losartan 25.  No difficulty taking her medication She is smoking 1 pack/day.  Discussed LDCT and she would be willing to do this.  She has a greater than 30-pack-year history of smoking. She is requesting a nebulizer for COPD she is on Anoro elliptica and albuterol.   ROS    Objective:     BP 120/76 (BP Location: Right Arm, Patient Position: Sitting, Cuff Size: Normal)   Pulse 84   Temp 97.7 F (36.5 C) (Oral)   Resp 18   Ht 5\' 2"  (1.575 m) Comment: per patient  Wt 224 lb 9.6 oz (101.9 kg)   SpO2 94%   BMI 41.08 kg/m    Physical Exam Vitals and nursing note reviewed.  Constitutional:      Appearance: Normal appearance.  HENT:     Head: Normocephalic and atraumatic.  Eyes:     Conjunctiva/sclera: Conjunctivae normal.  Cardiovascular:     Rate and Rhythm: Normal rate and regular rhythm.  Pulmonary:     Effort: Pulmonary effort is normal.     Breath sounds: Normal breath sounds.  Musculoskeletal:     Right lower leg: No edema.     Left lower leg: No edema.  Skin:    General: Skin is warm and dry.  Neurological:     Mental Status: She is alert and oriented to person, place, and time.  Psychiatric:        Mood and Affect: Mood normal.        Behavior: Behavior normal.        Thought  Content: Thought content normal.        Judgment: Judgment normal.          No results found for any visits on 03/07/23.    The ASCVD Risk score (Arnett DK, et al., 2019) failed to calculate for the following reasons:   Cannot find a previous HDL lab   Cannot find a previous total cholesterol lab    Assessment & Plan:  Controlled type 2 diabetes mellitus with complication, without long-term current use of insulin (HCC) Assessment & Plan: Checking A1c and CMP.  She is on metformin 500 mg twice daily.  Orders: -     Microalbumin / creatinine urine ratio -     Hemoglobin A1c -     CMP14+EGFR  Mixed hyperlipidemia Assessment & Plan: She is on Lipitor 10 mg daily.  Checking lipids  Orders: -     Lipid panel  Primary hypertension Assessment & Plan: Good control of her hypertension with amlodipine 10 mg HCTZ 25 mg and losartan 25 mg  Orders: -     amLODIPine Besylate; Take 1 tablet (10 mg total) by mouth daily.  Dispense: 30 tablet; Refill:  11  Chronic bronchitis, unspecified chronic bronchitis type [facility]) Assessment & Plan: Patient requested a nebulizer today she is on Anoro and albuterol.  Reassurance that her Anoro does not cause thrush  Orders: -     For home use only DME Nebulizer machine  Encounter for screening for lung cancer -     CT CHEST LUNG CANCER SCREENING LOW DOSE WO CONTRAST; Future  Health care maintenance Assessment & Plan: Declined the flu vaccine.  Had Prevnar 20 last year.   Depression, recurrent (HCC) Assessment & Plan: ZOX-0-96.  Followed by psychiatry.  Recent increase in Wellbutrin to 450 mg daily.   Other orders -     Albuterol Sulfate; Take 3 mLs (2.5 mg total) by nebulization every 4 (four) hours as needed for wheezing or shortness of breath (please include nebulizer machine, hoses, and mask if needed.).  Dispense: 360 mL; Refill: 6     Return in about 3 months (around 06/05/2023) for A1c.    Alease Medina, MD

## 2023-03-07 NOTE — Assessment & Plan Note (Signed)
She is on Lipitor 10 mg daily.  Checking lipids

## 2023-03-07 NOTE — Assessment & Plan Note (Signed)
Declined the flu vaccine.  Had Prevnar 20 last year.

## 2023-03-07 NOTE — Assessment & Plan Note (Signed)
Patient requested a nebulizer today she is on Anoro and albuterol.  Reassurance that her Anoro does not cause thrush

## 2023-03-08 ENCOUNTER — Telehealth: Payer: Self-pay

## 2023-03-08 ENCOUNTER — Ambulatory Visit: Payer: Medicaid Other | Admitting: Family Medicine

## 2023-03-08 LAB — MICROALBUMIN / CREATININE URINE RATIO
Creatinine, Urine: 133.3 mg/dL
Microalb/Creat Ratio: 4 mg/g{creat} (ref 0–29)
Microalbumin, Urine: 5.2 ug/mL

## 2023-03-08 LAB — CMP14+EGFR
ALT: 11 [IU]/L (ref 0–32)
AST: 10 [IU]/L (ref 0–40)
Albumin: 4.1 g/dL (ref 3.8–4.9)
Alkaline Phosphatase: 107 [IU]/L (ref 44–121)
BUN/Creatinine Ratio: 13 (ref 9–23)
BUN: 11 mg/dL (ref 6–24)
Bilirubin Total: <0.2 mg/dL (ref 0.0–1.2)
CO2: 24 mmol/L (ref 20–29)
Calcium: 9.2 mg/dL (ref 8.7–10.2)
Chloride: 103 mmol/L (ref 96–106)
Creatinine, Ser: 0.85 mg/dL (ref 0.57–1.00)
Globulin, Total: 2.1 g/dL (ref 1.5–4.5)
Glucose: 142 mg/dL — ABNORMAL HIGH (ref 70–99)
Potassium: 3.6 mmol/L (ref 3.5–5.2)
Sodium: 145 mmol/L — ABNORMAL HIGH (ref 134–144)
Total Protein: 6.2 g/dL (ref 6.0–8.5)
eGFR: 80 mL/min/{1.73_m2} (ref >59)

## 2023-03-08 LAB — LIPID PANEL
Chol/HDL Ratio: 4.1 {ratio} (ref 0.0–4.4)
Cholesterol, Total: 165 mg/dL (ref 100–199)
HDL: 40 mg/dL (ref >39)
LDL Chol Calc (NIH): 86 mg/dL (ref 0–99)
Triglycerides: 235 mg/dL — ABNORMAL HIGH (ref 0–149)
VLDL Cholesterol Cal: 39 mg/dL (ref 5–40)

## 2023-03-08 LAB — HEMOGLOBIN A1C
Est. average glucose Bld gHb Est-mCnc: 143 mg/dL
Hgb A1c MFr Bld: 6.6 % — ABNORMAL HIGH (ref 4.8–5.6)

## 2023-03-08 NOTE — Telephone Encounter (Signed)
Patient notified of lab results, verbalized understanding and agreed to add fish oil as advised by Dr. Girtha Rm.

## 2023-03-08 NOTE — Telephone Encounter (Signed)
-----   Message from BRIGGITTE BOLINE sent at 03/08/2023  3:14 PM EST ----- Darl Pikes,  Your are doing a great job with your diabetes! Your A1c was 6.6!  Your liver and kidneys are good.  Your triglycerides, part of your cholesterol, is elevated.  Please get fishoil 1200mg  over the counter and take one daily.  This will correct your triglycerides.   Best regards,

## 2023-04-22 ENCOUNTER — Other Ambulatory Visit: Payer: Self-pay | Admitting: Family Medicine

## 2023-04-22 DIAGNOSIS — I10 Essential (primary) hypertension: Secondary | ICD-10-CM

## 2023-04-22 NOTE — Telephone Encounter (Signed)
 Copied from CRM [id number]. Topic: Clinical - Medication Refill >> Apr 22, 2023 11:52 AM Geroge Baseman wrote: Most Recent Primary Care Visit:  Provider: Alease Medina  Department: [facility]  Visit Type: NEW PATIENT  Date: 03/07/2023  Medication: amLODipine (NORVASC) 10 MG, pantoprazole (PROTONIX) 40 MG  Has the patient contacted their pharmacy? Yes Call office  Is this the correct pharmacy for this prescription? Yes If no, delete pharmacy and type the correct one.  This is the patient's preferred pharmacy:  [id number] [place], [place] - [address] [place] [address] Phone: [phone] Fax: [phone]     Has the prescription been filled recently? No  Is the patient out of the medication? Yes  Has the patient been seen for an appointment in the last year OR does the patient have an upcoming appointment? Yes  Can we respond through MyChart? No  Agent: Please be advised that Rx refills may take up to 3 business days. We ask that you follow-up with your pharmacy.

## 2023-04-23 ENCOUNTER — Ambulatory Visit: Admitting: Family Medicine

## 2023-04-30 NOTE — Telephone Encounter (Signed)
 Copied from CRM [id number]. Topic: Medical Record Request - Provider/Facility Request >> Apr 29, 2023  3:23 PM Carlatta H wrote: Reason for CRM: Records via fax [phone] or [phone]//Requesting records for patient from [facility]

## 2023-05-03 ENCOUNTER — Telehealth: Payer: Self-pay

## 2023-05-03 NOTE — Telephone Encounter (Signed)
 Request has been routed to HIM department for review.

## 2023-05-03 NOTE — Telephone Encounter (Signed)
 Copied from CRM [id number]. Topic: Medical Record Request - Provider/Facility Request >> May 02, 2023  2:47 PM Ivette P wrote: Reason for CRM: Gilmer Mor called in to follow up on the faxed over medical records requests on 03/05. Gilmer Mor is requesting a time frame turn around time and would like call back.   Checked chart and do see under Media Tab.    Reference #: [id number]   Callback: [id number]

## 2023-05-10 ENCOUNTER — Ambulatory Visit (INDEPENDENT_AMBULATORY_CARE_PROVIDER_SITE_OTHER): Admitting: Family Medicine

## 2023-05-10 ENCOUNTER — Encounter: Payer: Self-pay | Admitting: Family Medicine

## 2023-05-10 VITALS — BP 120/76 | HR 96 | Temp 98.5°F | Resp 18 | Ht 62.0 in | Wt 221.0 lb

## 2023-05-10 DIAGNOSIS — J410 Simple chronic bronchitis: Secondary | ICD-10-CM | POA: Diagnosis not present

## 2023-05-10 DIAGNOSIS — K2100 Gastro-esophageal reflux disease with esophagitis, without bleeding: Secondary | ICD-10-CM | POA: Diagnosis not present

## 2023-05-10 DIAGNOSIS — E782 Mixed hyperlipidemia: Secondary | ICD-10-CM | POA: Diagnosis not present

## 2023-05-10 DIAGNOSIS — Z0000 Encounter for general adult medical examination without abnormal findings: Secondary | ICD-10-CM

## 2023-05-10 DIAGNOSIS — K921 Melena: Secondary | ICD-10-CM

## 2023-05-10 DIAGNOSIS — I10 Essential (primary) hypertension: Secondary | ICD-10-CM

## 2023-05-10 MED ORDER — AMLODIPINE BESYLATE 10 MG PO TABS: 10.0000 mg | ORAL_TABLET | Freq: Every day | ORAL | 3 refills | Status: AC

## 2023-05-10 MED ORDER — ANORO ELLIPTA 62.5-25 MCG/ACT IN AEPB: 1.0000 | INHALATION_SPRAY | Freq: Every day | RESPIRATORY_TRACT | 3 refills | Status: DC

## 2023-05-10 MED ORDER — ALBUTEROL SULFATE HFA 108 (90 BASE) MCG/ACT IN AERS: 2.0000 | INHALATION_SPRAY | RESPIRATORY_TRACT | 11 refills | Status: AC | PRN

## 2023-05-10 MED ORDER — ATORVASTATIN CALCIUM 10 MG PO TABS: 10.0000 mg | ORAL_TABLET | Freq: Every day | ORAL | 1 refills | Status: DC

## 2023-05-10 MED ORDER — HYDROCHLOROTHIAZIDE 25 MG PO TABS
25.0000 mg | ORAL_TABLET | Freq: Every day | ORAL | 1 refills | Status: DC
Start: 2023-05-10 — End: 2023-07-22

## 2023-05-10 MED ORDER — PANTOPRAZOLE SODIUM 40 MG PO TBEC: 40.0000 mg | DELAYED_RELEASE_TABLET | Freq: Every day | ORAL | 1 refills | Status: AC

## 2023-05-12 NOTE — Progress Notes (Signed)
 Established Patient Office Visit  Subjective   Patient ID: [name], female    DOB: 1965/03/31  Age: 58 y.o. MRN: [id number]  Chief Complaint  Patient presents with   Diabetes   Hearing Problem        GI Problem    Frequent bowel, blood clot    Diabetes  GI Problem   Delightful 58 year old woman with borderline diabetes, HTN, mixed hyperlipidemia, depression, COPD (greater than 30-pack-year history of smoking) and chronic back pain ([facility], L-spine MRI 2/24). For her hypertension she takes amlodipine 10 mg HCTZ 25 and losartan 25 mg daily.  For her depression she sees a psychiatrist and a therapist.  She is off Wellbutrin now but on Cymbalta 60 mg daily.  She thinks she is doing fairly well. Last A1c 6.6%.  Total cholesterol 165, Triggs 235, HDL 40, LDL 86 she is on atorvastatin 10 mg daily. She reports she is having blood in her stools.  Frequently passing blood clots on her stool.  She does not relate this to hard stools or hemorrhoids.  She had a colonoscopy 12/23 and had tubular adenomas. She was told that she had endometriosis causing her to have bloody stools.  Butr she no longer has a cycle but still has bleeding.     ROS    Objective:     BP 120/76 (BP Location: Left Arm, Patient Position: Sitting, Cuff Size: Normal)   Pulse 96   Temp 98.5 F (36.9 C) (Oral)   Resp 18   Ht 5\' 2"  (1.575 m)   Wt 221 lb (100.2 kg)   SpO2 98%   BMI 40.42 kg/m    Physical Exam Vitals and nursing note reviewed.  Constitutional:      Appearance: Normal appearance.  HENT:     Head: Normocephalic and atraumatic.  Eyes:     Conjunctiva/sclera: Conjunctivae normal.  Cardiovascular:     Rate and Rhythm: Normal rate and regular rhythm.  Pulmonary:     Effort: Pulmonary effort is normal.     Breath sounds: Normal breath sounds.  Musculoskeletal:     Right lower leg: No edema.     Left lower leg: No edema.  Skin:    General: Skin is warm and dry.   Neurological:     Mental Status: She is alert and oriented to person, place, and time.  Psychiatric:        Mood and Affect: Mood normal.        Behavior: Behavior normal.        Thought Content: Thought content normal.        Judgment: Judgment normal.          No results found for any visits on 05/10/23.    The 10-year ASCVD risk score (Arnett DK, et al., 2019) is: 12.5%    Assessment & Plan:  Simple chronic bronchitis (HCC) -     Anoro Ellipta; Inhale 1 puff into the lungs daily.  Dispense: 90 each; Refill: 3 -     Albuterol Sulfate HFA; Inhale 2 puffs into the lungs every 4 (four) hours as needed for wheezing or shortness of breath.  Dispense: 18 g; Refill: 11  Primary hypertension -     amLODIPine Besylate; Take 1 tablet (10 mg total) by mouth daily.  Dispense: 90 tablet; Refill: 3 -     hydroCHLOROthiazide; Take 1 tablet (25 mg total) by mouth daily.  Dispense: 90 tablet; Refill: 1  Mixed hyperlipidemia -  Atorvastatin Calcium; Take 1 tablet (10 mg total) by mouth daily.  Dispense: 90 tablet; Refill: 1  Gastroesophageal reflux disease with esophagitis without hemorrhage -     Pantoprazole Sodium; Take 1 tablet (40 mg total) by mouth daily.  Dispense: 90 tablet; Refill: 1  Hematochezia -     Ambulatory referral to Gastroenterology  Healthcare maintenance -     3D Screening Mammogram, Left and Right; Future     No follow-ups on file.    Alease Medina, MD

## 2023-05-15 NOTE — Telephone Encounter (Signed)
 Copied from CRM [id number]. Topic: Medical Record Request - Records Request >> May 10, 2023  5:46 PM Eunice Blase wrote: Monia Pouch per Christen Butter ph: [phone] fax: [phone] requesting Medical records. Faxed on 04/17/2023.

## 2023-05-29 ENCOUNTER — Telehealth: Payer: Self-pay | Admitting: Family Medicine

## 2023-05-29 NOTE — Telephone Encounter (Signed)
 Copied from CRM [id number]. Topic: Referral - Question >> May 29, 2023  3:47 PM Sasha H wrote: Reason for CRM: Pt called in stating she never received a referral to have a Endoscopy done.   Patient has a referral to GI [phone])

## 2023-05-30 ENCOUNTER — Encounter (HOSPITAL_BASED_OUTPATIENT_CLINIC_OR_DEPARTMENT_OTHER): Payer: Self-pay

## 2023-06-17 ENCOUNTER — Ambulatory Visit: Admitting: Family Medicine

## 2023-06-24 ENCOUNTER — Ambulatory Visit (INDEPENDENT_AMBULATORY_CARE_PROVIDER_SITE_OTHER): Admitting: Family Medicine

## 2023-06-24 ENCOUNTER — Encounter: Payer: Self-pay | Admitting: Family Medicine

## 2023-06-24 ENCOUNTER — Other Ambulatory Visit: Payer: Self-pay | Admitting: Family Medicine

## 2023-06-24 VITALS — BP 117/73 | HR 82 | Temp 98.0°F | Resp 20 | Ht 62.0 in | Wt 216.0 lb

## 2023-06-24 DIAGNOSIS — R61 Generalized hyperhidrosis: Secondary | ICD-10-CM | POA: Insufficient documentation

## 2023-06-24 DIAGNOSIS — J418 Mixed simple and mucopurulent chronic bronchitis: Secondary | ICD-10-CM

## 2023-06-24 DIAGNOSIS — J449 Chronic obstructive pulmonary disease, unspecified: Secondary | ICD-10-CM

## 2023-06-24 DIAGNOSIS — F17200 Nicotine dependence, unspecified, uncomplicated: Secondary | ICD-10-CM | POA: Insufficient documentation

## 2023-06-24 DIAGNOSIS — E118 Type 2 diabetes mellitus with unspecified complications: Secondary | ICD-10-CM | POA: Diagnosis not present

## 2023-06-24 DIAGNOSIS — K625 Hemorrhage of anus and rectum: Secondary | ICD-10-CM | POA: Insufficient documentation

## 2023-06-24 DIAGNOSIS — E782 Mixed hyperlipidemia: Secondary | ICD-10-CM

## 2023-06-24 DIAGNOSIS — Z1231 Encounter for screening mammogram for malignant neoplasm of breast: Secondary | ICD-10-CM

## 2023-06-24 DIAGNOSIS — F17211 Nicotine dependence, cigarettes, in remission: Secondary | ICD-10-CM

## 2023-06-24 LAB — POCT GLYCOSYLATED HEMOGLOBIN (HGB A1C): Hemoglobin A1C: 6.4 % — AB (ref 4.0–5.6)

## 2023-06-24 MED ORDER — BREZTRI AEROSPHERE 160-9-4.8 MCG/ACT IN AERO: 2.0000 | INHALATION_SPRAY | Freq: Two times a day (BID) | RESPIRATORY_TRACT | 11 refills | Status: DC

## 2023-06-24 MED ORDER — LANCET DEVICE MISC: 1.0000 | Freq: Three times a day (TID) | 0 refills | Status: AC

## 2023-06-24 MED ORDER — ATORVASTATIN CALCIUM 20 MG PO TABS: 20.0000 mg | ORAL_TABLET | Freq: Every day | ORAL | 3 refills | Status: DC

## 2023-06-24 MED ORDER — LANCETS MISC. MISC: 1.0000 | Freq: Three times a day (TID) | 0 refills | Status: AC

## 2023-06-24 MED ORDER — BLOOD GLUCOSE MONITORING SUPPL DEVI: 1.0000 | Freq: Three times a day (TID) | 0 refills | Status: AC

## 2023-06-24 MED ORDER — BLOOD GLUCOSE TEST VI STRP
1.0000 | ORAL_STRIP | Freq: Three times a day (TID) | 0 refills | Status: DC
Start: 2023-06-24 — End: 2023-08-01

## 2023-06-24 NOTE — Progress Notes (Signed)
 Established Patient Office Visit  Subjective   Patient ID: [name], female    DOB: October 26, 1965  Age: 58 y.o. MRN: [id number]  Chief Complaint  Patient presents with   Medical Management of Chronic Issues   Night Sweats    Started 2 wks ago     HPI Delightful 58 yo woman with DMT2 (03/07/2023 A1c 6.6%), HTN, mixed hyperlipidemia, depression, COPD (greater than 30-pyhx of smoking) and chronic lumbar pain, ([facility]). She reports night sweats for the last 2 weeks.  She denies having sweats during the day.  She denies a fever.  She thinks she has lost between 10 and 12 pounds over the last couple of months when she had a respiratory virus.   She does have a cough productive of sputum that is clear to white.  Occasionally she hears herself wheezing.  She is on Anoro and albuterol .   She reports that she still has blood in her stools about 1 day a month.  She reports she was told it was part of her endometriosis but she had a total hysterectomy about 10 years ago and continues to have rectal bleeding.  She denies that her stools are hard.  She reports she has a bowel movement after every meal, immediately after a meal and she has 2 days of multiple diarrheal stools weekly.  She has not noticed which foods are more likely to make her have a bowel movement.  She does not take fiber.  She is on metformin  500 mg BID with meals   03/07/2023 Total cholesterol 165, Triggs 235, HDL 40, LDL 86.  She is on atorvastatin  10 mg daily     ROS    Objective:     BP 117/73 (BP Location: Left Arm, Patient Position: Sitting, Cuff Size: Normal)   Pulse 82   Temp 98 F (36.7 C) (Oral)   Resp 20   Ht 5\' 2"  (1.575 m)   Wt 216 lb (98 kg)   SpO2 92%   BMI 39.51 kg/m    Physical Exam       Results for orders placed or performed in visit on 06/24/23  POCT glycosylated hemoglobin (Hb A1C)  Result Value Ref Range   Hemoglobin A1C 6.4 (A) 4.0 - 5.6 %   HbA1c POC (<> result, manual  entry)     HbA1c, POC (prediabetic range)     HbA1c, POC (controlled diabetic range)        The 10-year ASCVD risk score (Arnett DK, et al., 2019) is: 11.9%    Assessment & Plan:  Controlled type 2 diabetes mellitus with complication, without long-term current use of insulin (HCC) Assessment & Plan: A1c 6.4% on metformin  500 mg twice daily only.  Encouraged her to take an 81 mg aspirin daily.  Check her feet daily and see ophthalmology once a year.  Orders: -     POCT glycosylated hemoglobin (Hb A1C) -     Blood Glucose Monitoring Suppl; 1 each by Does not apply route in the morning, at noon, and at bedtime. May substitute to any manufacturer covered by patient's insurance.  Dispense: 1 each; Refill: 0 -     Blood Glucose Test; 1 each by In Vitro route in the morning, at noon, and at bedtime. May substitute to any manufacturer covered by patient's insurance.  Dispense: 100 strip; Refill: 0 -     Lancet Device; 1 each by Does not apply route in the morning, at noon, and  at bedtime. May substitute to any manufacturer covered by patient's insurance.  Dispense: 1 each; Refill: 0 -     Lancets Misc.; 1 each by Does not apply route in the morning, at noon, and at bedtime. May substitute to any manufacturer covered by patient's insurance.  Dispense: 100 each; Refill: 0  Unexplained night sweats -     Sedimentation rate -     QuantiFERON-TB Gold Plus -     CBC with Differential/Platelet -     CMP14+EGFR -     ANA  Cigarette nicotine dependence Assessment & Plan: She is still smoking reports she is trying to stop.  She has a greater than 30-pack-year history of smoking and is due a low-dose CT scan of the chest.  Orders: -     CT CHEST LUNG CANCER SCREENING LOW DOSE WO CONTRAST; Future  Encounter for screening mammogram for malignant neoplasm of breast Assessment & Plan: She is due for mammogram  Orders: -     MM 3D DIAGNOSTIC MAMMOGRAM BILATERAL BREAST; Future  COPD mixed type  (HCC) -     Breztri Aerosphere; Inhale 2 puffs into the lungs 2 (two) times daily.  Dispense: 1 g; Refill: 11  Mixed simple and mucopurulent chronic bronchitis (HCC) Assessment & Plan: Faint wheezing on exam today.  Anoro not sufficient for her symptoms.  Stop Anoro.  Switch to triple therapy with Breztri 2 puffs twice daily and rinse her mouth out after use   Night sweats Assessment & Plan: She reports night sweats each night for the last 2 weeks.  Ask her to check her blood sugar when she is sweating.  Checking sed rate, ANA, QuantiFERON, CBC and CMP.  Getting her scheduled for a low-dose CT scan of the chest.   Rectal bleeding Assessment & Plan: Reports that she passes blood clots with her stool at least 1 day a month.   Mixed hyperlipidemia Assessment & Plan: 03/07/2023 total cholesterol 165, Triggs 235, HDL 40, LDL 86.  She is on atorvastatin  10 mg daily.  Increase this to atorvastatin  20 mg daily.   Other orders -     Atorvastatin  Calcium ; Take 1 tablet (20 mg total) by mouth daily.  Dispense: 90 tablet; Refill: 3     Return in about 2 weeks (around 07/08/2023).    Magda Muise K Alita Waldren, MD

## 2023-06-24 NOTE — Assessment & Plan Note (Addendum)
 She reports night sweats each night for the last 2 weeks.  Ask her to check her blood sugar when she is sweating.  Checking sed rate, ANA, QuantiFERON, CBC and CMP.  Getting her scheduled for a low-dose CT scan of the chest.

## 2023-06-24 NOTE — Assessment & Plan Note (Signed)
 A1c 6.4% on metformin  500 mg twice daily only.  Encouraged her to take an 81 mg aspirin daily.  Check her feet daily and see ophthalmology once a year.

## 2023-06-24 NOTE — Assessment & Plan Note (Signed)
 She is still smoking reports she is trying to stop.  She has a greater than 30-pack-year history of smoking and is due a low-dose CT scan of the chest.

## 2023-06-24 NOTE — Assessment & Plan Note (Signed)
 Reports that she passes blood clots with her stool at least 1 day a month.

## 2023-06-24 NOTE — Assessment & Plan Note (Addendum)
 Faint wheezing on exam today.  Anoro not sufficient for her symptoms.  Stop Anoro.  Switch to triple therapy with Breztri 2 puffs twice daily and rinse her mouth out after use

## 2023-06-24 NOTE — Assessment & Plan Note (Signed)
She is due for mammogram. 

## 2023-06-24 NOTE — Assessment & Plan Note (Signed)
 03/07/2023 total cholesterol 165, Triggs 235, HDL 40, LDL 86.  She is on atorvastatin  10 mg daily.  Increase this to atorvastatin  20 mg daily.

## 2023-06-27 ENCOUNTER — Telehealth: Payer: Self-pay

## 2023-06-27 DIAGNOSIS — J449 Chronic obstructive pulmonary disease, unspecified: Secondary | ICD-10-CM

## 2023-06-27 LAB — CMP14+EGFR
ALT: 5 IU/L (ref 0–32)
AST: 10 IU/L (ref 0–40)
Albumin: 4.1 g/dL (ref 3.8–4.9)
Alkaline Phosphatase: 97 IU/L (ref 44–121)
BUN/Creatinine Ratio: 17 (ref 9–23)
BUN: 13 mg/dL (ref 6–24)
Bilirubin Total: <0.2 mg/dL (ref 0.0–1.2)
CO2: 26 mmol/L (ref 20–29)
Calcium: 9.1 mg/dL (ref 8.7–10.2)
Chloride: 100 mmol/L (ref 96–106)
Creatinine, Ser: 0.75 mg/dL (ref 0.57–1.00)
Globulin, Total: 2.2 g/dL (ref 1.5–4.5)
Glucose: 148 mg/dL — ABNORMAL HIGH (ref 70–99)
Potassium: 3.1 mmol/L — ABNORMAL LOW (ref 3.5–5.2)
Sodium: 141 mmol/L (ref 134–144)
Total Protein: 6.3 g/dL (ref 6.0–8.5)
eGFR: 93 mL/min/{1.73_m2} (ref >59)

## 2023-06-27 LAB — CBC WITH DIFFERENTIAL/PLATELET
Basophils Absolute: 0 10*3/uL (ref 0.0–0.2)
Basos: 0 %
EOS (ABSOLUTE): 0.3 10*3/uL (ref 0.0–0.4)
Eos: 4 %
Hematocrit: 38.3 % (ref 34.0–46.6)
Hemoglobin: 12.4 g/dL (ref 11.1–15.9)
Immature Grans (Abs): 0 10*3/uL (ref 0.0–0.1)
Immature Granulocytes: 0 %
Lymphocytes Absolute: 1.7 10*3/uL (ref 0.7–3.1)
Lymphs: 19 %
MCH: 29.7 pg (ref 26.6–33.0)
MCHC: 32.4 g/dL (ref 31.5–35.7)
MCV: 92 fL (ref 79–97)
Monocytes Absolute: 0.4 10*3/uL (ref 0.1–0.9)
Monocytes: 5 %
Neutrophils Absolute: 6.4 10*3/uL (ref 1.4–7.0)
Neutrophils: 72 %
Platelets: 308 10*3/uL (ref 150–450)
RBC: 4.18 x10E6/uL (ref 3.77–5.28)
RDW: 13 % (ref 11.7–15.4)
WBC: 8.9 10*3/uL (ref 3.4–10.8)

## 2023-06-27 LAB — QUANTIFERON-TB GOLD PLUS
QuantiFERON Mitogen Value: >10 [IU]/mL
QuantiFERON Nil Value: 0.03 [IU]/mL
QuantiFERON TB1 Ag Value: 0.06 [IU]/mL
QuantiFERON TB2 Ag Value: 0.09 [IU]/mL
QuantiFERON-TB Gold Plus: NEGATIVE

## 2023-06-27 LAB — SEDIMENTATION RATE: Sed Rate: 16 mm/h (ref 0–40)

## 2023-06-27 LAB — ANA: Anti Nuclear Antibody (ANA): NEGATIVE

## 2023-06-27 MED ORDER — BREZTRI AEROSPHERE 160-9-4.8 MCG/ACT IN AERO: 2.0000 | INHALATION_SPRAY | Freq: Two times a day (BID) | RESPIRATORY_TRACT | 11 refills | Status: DC

## 2023-06-27 NOTE — Telephone Encounter (Signed)
 Copied from CRM [id number]. Topic: Clinical - Prescription Issue >> Jun 27, 2023  2:21 PM Ivette P wrote: Reason for CRM: Pt called in because was waiting on refill for medication   budeson-glycopyrrolate-formoterol (BREZTRI AEROSPHERE) 160-9-4.8 MCG/ACT AERO inhaler  Advised pt that medication was sent to the  [id number] [place], [place] - [address]  On 06/24/2023  Confirmation details: ig - Route: Inhale 2 puffs into the lungs 2 (two) times daily. - Inhalation  Sent to pharmacy as: budeson-glycopyrrolate-formoterol (BREZTRI AEROSPHERE) 160-9-4.8 MCG/ACT Aerosol inhaler  E-Prescribing Status: Receipt confirmed by pharmacy (06/24/2023  4:09 PM EDT)     Pt would like for someone to follow up with her. Number [phone]

## 2023-06-27 NOTE — Telephone Encounter (Signed)
 Refill sent.

## 2023-06-27 NOTE — Telephone Encounter (Signed)
 Copied from CRM [id number]. Topic: Clinical - Prescription Issue >> Jun 27, 2023  4:15 PM Sophia H wrote: Reason for CRM: Pt was prescribed budesonide-glycopyrrolate-formoterol (BREZTRI AEROSPHERE) 160-9-4.8 MCG/ACT AERO inhaler and it is not covered by insurance, it will cost her $700 out of pocket and she cannot afford that. Pt is requesting an alternative to the medication prescribed that her insurance will cover. Best Contact # [phone]

## 2023-06-27 NOTE — Telephone Encounter (Signed)
 Copied from CRM [id number]. Topic: Clinical - Medication Refill >> Jun 27, 2023  2:27 PM Lysbeth Sauger wrote: Medication: lorsatan  Has the patient contacted their pharmacy? Yes (Agent: If no, request that the patient contact the pharmacy for the refill. If patient does not wish to contact the pharmacy document the reason why and proceed with request.) (Agent: If yes, when and what did the pharmacy advise?) they dont have the refill that the dr supposed to called in  This is the patient's preferred pharmacy:  [id number] [place], [place] - [address] [place] [address] Phone: [phone] Fax: [phone]    Is this the correct pharmacy for this prescription? Yes If no, delete pharmacy and type the correct one.   Has the prescription been filled recently? No  Is the patient out of the medication? Yes  Has the patient been seen for an appointment in the last year OR does the patient have an upcoming appointment? Yes  Can we respond through MyChart? No  Agent: Please be advised that Rx refills may take up to 3 business days. We ask that you follow-up with your pharmacy.

## 2023-06-28 ENCOUNTER — Ambulatory Visit: Payer: Self-pay | Admitting: Family Medicine

## 2023-06-28 ENCOUNTER — Other Ambulatory Visit: Payer: Self-pay | Admitting: Family Medicine

## 2023-06-28 DIAGNOSIS — J410 Simple chronic bronchitis: Secondary | ICD-10-CM

## 2023-06-28 MED ORDER — FLUTICASONE FUROATE-VILANTEROL 100-25 MCG/ACT IN AEPB: 1.0000 | INHALATION_SPRAY | Freq: Every day | RESPIRATORY_TRACT | 11 refills | Status: DC

## 2023-07-05 ENCOUNTER — Telehealth: Payer: Self-pay | Admitting: Family Medicine

## 2023-07-05 NOTE — Telephone Encounter (Signed)
 Pt called and stated that we are getting PA on wrong medication.  She said insurance does not cover first one and that a PA needed to be done on the Breo.  I spoke with Alexandria Ida and she said to inform pt that a PA for the Breztri  has been submitted, she said that if they don't approve that then she would do a PA on the breo.  Routing to [employer] as a FYI

## 2023-07-05 NOTE — Telephone Encounter (Signed)
(  Key: [facility])  PA Case ID #: [id number]  Status Sent to Plan today  Drug Breztri  Aerosphere 160-9-4.8MCG/ACT aerosol  Form [employer] Electronic Prior Authorization Form [phone] NCPDP)

## 2023-07-05 NOTE — Telephone Encounter (Signed)
 Copied from CRM [id number]. Topic: Clinical - Prescription Issue >> Jun 27, 2023  4:15 PM Sophia H wrote: Reason for CRM: Pt was prescribed budesonide-glycopyrrolate-formoterol (BREZTRI  AEROSPHERE) 160-9-4.8 MCG/ACT AERO inhaler and it is not covered by insurance, it will cost her $700 out of pocket and she cannot afford that. Pt is requesting an alternative to the medication prescribed that her insurance will cover. Best Contact # [phone] >> Jul 05, 2023 10:01 AM Felizardo Hotter wrote: Pt called needs alterative to budesonide-glycopyrrolate-formoterol (BREZTRI  AEROSPHERE) 160-9-4.8 MCG/ACT AERO inhaler, this is not covered by insurance. Pt would like a call back at [phone]

## 2023-07-09 ENCOUNTER — Other Ambulatory Visit: Payer: Self-pay | Admitting: Family Medicine

## 2023-07-09 ENCOUNTER — Ambulatory Visit: Admitting: Family Medicine

## 2023-07-09 DIAGNOSIS — J418 Mixed simple and mucopurulent chronic bronchitis: Secondary | ICD-10-CM

## 2023-07-09 MED ORDER — BUDESONIDE-FORMOTEROL FUMARATE 160-4.5 MCG/ACT IN AERO: 2.0000 | INHALATION_SPRAY | Freq: Two times a day (BID) | RESPIRATORY_TRACT | 11 refills | Status: AC

## 2023-07-09 NOTE — Telephone Encounter (Signed)
 Bretztri was denied. Patient has to have tried and failed 2 preferred formulary.  The preferred drugs: brand Symbicort , brand Advair Diskus, Dulera

## 2023-07-15 ENCOUNTER — Ambulatory Visit: Admitting: Family Medicine

## 2023-07-22 ENCOUNTER — Ambulatory Visit (INDEPENDENT_AMBULATORY_CARE_PROVIDER_SITE_OTHER): Admitting: Family Medicine

## 2023-07-22 ENCOUNTER — Encounter: Payer: Self-pay | Admitting: Family Medicine

## 2023-07-22 ENCOUNTER — Other Ambulatory Visit: Payer: Self-pay | Admitting: Family Medicine

## 2023-07-22 VITALS — BP 131/81 | HR 78 | Temp 98.3°F | Resp 18 | Ht 62.0 in | Wt 217.0 lb

## 2023-07-22 DIAGNOSIS — E118 Type 2 diabetes mellitus with unspecified complications: Secondary | ICD-10-CM | POA: Diagnosis not present

## 2023-07-22 DIAGNOSIS — I10 Essential (primary) hypertension: Secondary | ICD-10-CM | POA: Diagnosis not present

## 2023-07-22 DIAGNOSIS — K625 Hemorrhage of anus and rectum: Secondary | ICD-10-CM | POA: Diagnosis not present

## 2023-07-22 DIAGNOSIS — Z122 Encounter for screening for malignant neoplasm of respiratory organs: Secondary | ICD-10-CM

## 2023-07-22 DIAGNOSIS — J418 Mixed simple and mucopurulent chronic bronchitis: Secondary | ICD-10-CM

## 2023-07-22 DIAGNOSIS — R197 Diarrhea, unspecified: Secondary | ICD-10-CM | POA: Insufficient documentation

## 2023-07-22 MED ORDER — HYDROCHLOROTHIAZIDE 25 MG PO TABS: 25.0000 mg | ORAL_TABLET | Freq: Every day | ORAL | 1 refills | Status: DC

## 2023-07-22 MED ORDER — LOSARTAN POTASSIUM 25 MG PO TABS: 25.0000 mg | ORAL_TABLET | Freq: Every day | ORAL | 1 refills | Status: DC

## 2023-07-22 NOTE — Assessment & Plan Note (Signed)
 She is on albuterol  and Symbicort  160/4.5.  Try to get her triple therapy with Breztri  but her insurance company would not cover that.  Reports she is doing much better since starting Symbicort .  Less mucopurulent phlegm and less shortness of breath with activities.

## 2023-07-22 NOTE — Assessment & Plan Note (Signed)
 She passes blood clots once a month or so.   Asked her to increase the fiber in her diet.  Take some fiber each day.

## 2023-07-22 NOTE — Progress Notes (Signed)
 Established Patient Office Visit  Subjective   Patient ID: [name], female    DOB: 10-07-1965  Age: 59 y.o. MRN: [id number]  Chief Complaint  Patient presents with   Medical Management of Chronic Issues   Referral    GI    HPI Delightful 58 year old woman with DMT2 (06/24/2023 A1c 6.4%), HTN, mixed hyperlipidemia, depression, COPD (greater than 30-pyr hx of smoking, 06/25/2022 LDCT: emphysema, scattered solid micronodules, no suspicious pulmonary nodules. FOLLOW-UP in 1 year.), and chronic lumbar pain ([facility]), s/p hemorrhoidectomy (08/03/2011),  cholecystectomy (06/28/2011), s/p TAH, colonoscopy (01/29/2022, TA, FOLLOW-UP 5-7 years).      She was having mucopurulent sputum.  Try to get her on Breztri  but her insurance company denied this.  She is now on Symbicort  160/4.5.  Reports her breathing is much improved with less shortness of breath and less coughing. She still uses albuterol  daily.        She is on metformin  500 mg twice daily with meals for her diabetes.  Reminded her to take 81 mg aspirin daily and to see an ophthalmologist.  She does check her feet daily.      She reports bright red blood per rectum with clots that happens maybe once a month.  She has a history of hemorrhoids and hemorrhoidectomy. She reports that she has diarrhea every time she eats.  She has been taking pantoprazole  40 mg daily.  She takes Imodium A-D to help with the diarrhea.  She very seldom gets constipation and if she does she uses stool softeners.  She does not have intentional fiber in her diet. She goes to [facility].  07/11/2023 she got oxycodone  10/325 #120 filled.  On 07/15/2023 she got gabapentin 600 mg one 4 times a day.      ROS    Objective:     BP 131/81 (BP Location: Left Arm, Patient Position: Sitting, Cuff Size: Normal)   Pulse 78   Temp 98.3 F (36.8 C) (Oral)   Resp 18   Ht 5\' 2"  (1.575 m)   Wt 217 lb (98.4 kg)   SpO2 90%   BMI 39.69 kg/m     Physical Exam Vitals and nursing note reviewed.  Constitutional:      Appearance: Normal appearance.  HENT:     Head: Normocephalic and atraumatic.  Eyes:     Conjunctiva/sclera: Conjunctivae normal.  Cardiovascular:     Rate and Rhythm: Normal rate and regular rhythm.  Pulmonary:     Effort: Pulmonary effort is normal.     Breath sounds: Normal breath sounds.  Musculoskeletal:     Right lower leg: No edema.     Left lower leg: No edema.  Skin:    General: Skin is warm and dry.  Neurological:     Mental Status: She is alert and oriented to person, place, and time.  Psychiatric:        Mood and Affect: Mood normal.        Behavior: Behavior normal.        Thought Content: Thought content normal.        Judgment: Judgment normal.          No results found for any visits on 07/22/23.    The 10-year ASCVD risk score (Arnett DK, et al., 2019) is: 14.8%    Assessment & Plan:  Controlled type 2 diabetes mellitus with complication, without long-term current use of insulin [facility]) Assessment & Plan: Ask her to  stop amlodipine  10 mg a day and take Losartan 25 mg instead for renal protection.  FOLLOW-UP in a month to check renal function.   She does take ASA 81mg  daily.   She is on metformin  500 mg twice daily and has no side effects from this.   Primary hypertension -     hydroCHLOROthiazide ; Take 1 tablet (25 mg total) by mouth daily.  Dispense: 90 tablet; Refill: 1 -     Losartan Potassium; Take 1 tablet (25 mg total) by mouth daily.  Dispense: 90 tablet; Refill: 1  Mixed simple and mucopurulent chronic bronchitis (HCC) Assessment & Plan: She is on albuterol  and Symbicort  160/4.5.  Try to get her triple therapy with Breztri  but her insurance company would not cover that.  Reports she is doing much better since starting Symbicort .  Less mucopurulent phlegm and less shortness of breath with activities.   Screening for lung cancer -     CT CHEST LUNG CANCER SCREENING  LOW DOSE WO CONTRAST; Future  Rectal bleeding Assessment & Plan: She passes blood clots once a month or so.   Asked her to increase the fiber in her diet.  Take some fiber each day.     Diarrhea, unspecified type Assessment & Plan: Asked her to stop norvasc  5mg  and start back on losartan 25mg  daily.  This is for renal protection from Diabetes.  Please FOLLOW-UP in a month to check blood pressure and renal function.        Return in about 4 weeks (around 08/19/2023).    Ziyan Schoon K Maayan Jenning, MD

## 2023-07-22 NOTE — Assessment & Plan Note (Signed)
 Asked her to stop norvasc  5mg  and start back on losartan 25mg  daily.  This is for renal protection from Diabetes.  Please FOLLOW-UP in a month to check blood pressure and renal function.

## 2023-07-22 NOTE — Assessment & Plan Note (Addendum)
 Ask her to stop amlodipine  10 mg a day and take Losartan 25 mg instead for renal protection.  FOLLOW-UP in a month to check renal function.   She does take ASA 81mg  daily.   She is on metformin  500 mg twice daily and has no side effects from this.

## 2023-08-21 ENCOUNTER — Ambulatory Visit: Admitting: Family Medicine

## 2023-08-23 NOTE — Progress Notes (Addendum)
 Referring Physician:  Garnell Sara CROME, [name] [address] [place],  [place] [address]  Primary Physician:  [name], Sara K, MD  History of Present Illness: 08/29/23 [name] is a 58 y.o with a long standing history of lumbosacral complains who is here today with a chief complaint of ongoing back and bilateral posterior leg pain R>L. She states this has been going on for the last 2 years and is worse with climbing stairs, standing and walking and improves some with sitting and laying down.  Her symptoms have worsened over the last couple of years in severity but have not changed in location. She has not had an improvement despite multiple injections and medications.  Of note she is a current nicotine user.   Conservative measures:  Physical therapy:  has not participated in recently Multimodal medical therapy including regular antiinflammatories:  Gabapentin, Prednisone, Percocet, Diclofenac gel Injections:  07/27/22- Lumbar MBB L3-L4 and L4-L5 bilateral 09/03/22- B/L MBB L5-S 02/18/23- B/L SIJ 07/04/23 B/L TFESI L3-L4   Past Surgery:  2007-screws present in the neck  Devere Landry Sic has no symptoms of cervical myelopathy.  The symptoms are causing a significant impact on the patient's life.   Review of Systems:  A 10 point review of systems is negative, except for the pertinent positives and negatives detailed in the HPI.  Past Medical History: Past Medical History:  Diagnosis Date   Anxiety    Depression    Hypertension    Lumbar spondylolysis    Sacroiliac joint pain     Past Surgical History: Past Surgical History:  Procedure Laterality Date   ABDOMINAL HYSTERECTOMY     APPENDECTOMY     CESAREAN SECTION     X2   CHOLECYSTECTOMY     COLONOSCOPY WITH PROPOFOL  N/A 01/29/2022   Procedure: COLONOSCOPY WITH PROPOFOL ;  Surgeon: Sara Corinn Skiff, MD;  Location: [facility] ENDOSCOPY;  Service: Gastroenterology;  Laterality: N/A;     Allergies: Allergies as of 08/29/2023 - Review Complete 08/29/2023  Allergen Reaction Noted   Hydrocodone Anaphylaxis 05/30/2021   Tramadol  Hives 03/15/2017   Hydrocodone-acetaminophen  Rash 02/18/2023    Medications: Outpatient Encounter Medications as of 08/29/2023  Medication Sig   ACCU-CHEK GUIDE TEST test strip 1 EACH BY IN VITRO ROUTE IN THE MORNING, AT NOON, AND AT BEDTIME. MAY SUBSTITUTE TO ANY MANUFACTURER COVERED BY PATIENT'S INSURANCE.   Accu-Chek Softclix Lancets lancets SMARTSIG:Topical   albuterol  (PROVENTIL ) (2.5 MG/3ML) 0.083% nebulizer solution Take 3 mLs (2.5 mg total) by nebulization every 4 (four) hours as needed for wheezing or shortness of breath (please include nebulizer machine, hoses, and mask if needed.).   albuterol  (VENTOLIN  HFA) 108 (90 Base) MCG/ACT inhaler Inhale 2 puffs into the lungs every 4 (four) hours as needed for wheezing or shortness of breath.   amLODipine  (NORVASC ) 10 MG tablet Take 1 tablet (10 mg total) by mouth daily.   atorvastatin  (LIPITOR) 20 MG tablet Take 20 mg by mouth daily.   Blood Glucose Monitoring Suppl DEVI 1 each by Does not apply route in the morning, at noon, and at bedtime. May substitute to any manufacturer covered by patient's insurance.   budesonide -formoterol  (SYMBICORT ) 160-4.5 MCG/ACT inhaler Inhale 2 puffs into the lungs 2 (two) times daily.   diclofenac Sodium (VOLTAREN) 1 % GEL as directed Transdermal two times per day for 30 days (Patient not taking: Reported on 09/02/2023)   DULoxetine (CYMBALTA) 60 MG capsule Take 90 mg by mouth daily.   gabapentin (NEURONTIN) 600  MG tablet 1 capsule Orally four times daily for 30 days   hydrochlorothiazide  (HYDRODIURIL ) 25 MG tablet Take 1 tablet (25 mg total) by mouth daily.   losartan  (COZAAR ) 25 MG tablet Take 1 tablet (25 mg total) by mouth daily.   oxyCODONE -acetaminophen  (PERCOCET) 10-325 MG tablet Take 1 tablet by mouth 4 (four) times daily as needed.   pantoprazole  (PROTONIX )  40 MG tablet Take 1 tablet (40 mg total) by mouth daily.   traZODone (DESYREL) 100 MG tablet Take 100 mg by mouth at bedtime as needed.   [DISCONTINUED] metFORMIN  (GLUCOPHAGE ) 500 MG tablet Take 1 tablet (500 mg total) by mouth 2 (two) times daily with a meal.   No facility-administered encounter medications on file as of 08/29/2023.    Social History: Social History   Tobacco Use   Smoking status: Every Day    Types: Cigarettes    Passive exposure: Current   Smokeless tobacco: Never  Vaping Use   Vaping status: Never Used  Substance Use Topics   Alcohol use: Yes   Drug use: No    Family Medical History: History reviewed. No pertinent family history.  Physical Examination: Vitals:   08/29/23 0908  BP: 126/72     General: Patient is well developed, well nourished, calm, collected, and in no apparent distress. Attention to examination is appropriate.  Psychiatric: Patient is non-anxious.  Head:  Pupils equal, round, and reactive to light.  ENT:  Oral mucosa appears well hydrated.  Neck:   Supple. Respiratory: Patient is breathing without any difficulty.  Extremities: No edema.  Vascular: Palpable dorsal pedal pulses.  Skin:   On exposed skin, there are no abnormal skin lesions.  NEUROLOGICAL:     Awake, alert, oriented to person, place, and time.  Speech is clear and fluent. Fund of knowledge is appropriate.   Cranial Nerves: Pupils equal round and reactive to light.  Facial tone is symmetric.  Facial sensation is symmetric.   Palpation of spine: TTP throughout lumbar spine  Strength: Side Biceps Triceps Deltoid Interossei Grip Wrist Ext. Wrist Flex.  R 5 5 5 5 5 5 5   L 5 5 5 5 5 5 5    Side Iliopsoas Quads Hamstring PF DF EHL  R 5 5 5 5 5 5   L 5 5 5 5 5 5    Reflexes are 1+ and symmetric at the biceps, triceps, brachioradialis, patella and achilles.   Hoffman's is absent.  Clonus is not present.  Toes are down-going.  Bilateral upper and lower extremity  sensation is intact to light touch.    Gait is normal.   No difficulty with tandem gait.   No evidence of dysmetria noted.  Medical Decision Making  Imaging: 04/05/22 MRI lumbar spine FINDINGS: Segmentation:  Standard.   Alignment:  Physiologic.   Vertebrae: No acute fracture, evidence of discitis, or aggressive bone lesion. Small Schmorl's node along the superior endplate of T12. Small hemangioma in the posterior T12 vertebral body.   Conus medullaris and cauda equina: Conus extends to the T12 level. Conus and cauda equina appear normal.   Paraspinal and other soft tissues: No acute paraspinal abnormality.   Disc levels:   Disc spaces: Disc desiccation at T12-L1, L1-2, L2-3, L3-4 and L4-5. Mild disc height loss at T11-12.   T11-12: Mild broad-based disc bulge. No foraminal or central canal stenosis.   T12-L1: No significant disc bulge. No neural foraminal stenosis. No central canal stenosis.   L1-L2: No significant disc bulge. No neural foraminal  stenosis. No central canal stenosis.   L2-L3: No significant disc bulge. No neural foraminal stenosis. No central canal stenosis. Mild bilateral facet arthropathy.   L3-L4: Minimal broad-based disc bulge. Mild bilateral facet arthropathy. No foraminal or central canal stenosis.   L4-L5: Mild broad-based disc bulge. Moderate bilateral facet arthropathy. No foraminal or central canal stenosis.   L5-S1: No significant disc bulge. No neural foraminal stenosis. No central canal stenosis.   IMPRESSION: 1. Mild lumbar spine spondylosis as described above. 2. No acute osseous injury of the lumbar spine.     Electronically Signed   By: [name] M.D.   On: 04/08/2022 08:26   I have personally reviewed the images and agree with the above interpretation.  Assessment and Plan: [name] is a pleasant 58 y.o. female with a long standing history of low back pain bilateral leg pain.  Despite multiple conservative  interventions she has not had significant improvement.  We briefly discussed physical therapy however she is not interested in this.  The majority of her symptoms are likely related to her facet arthropathy however she has not had significant improvement with MBB.  She does not have any underlying stenosis that explains her radicular complaints.  I recommended further evaluation for spinal cord stimulator which was briefly discussed with her by [name] at [facility]. I placed a referral back to them for evaluation of this. We also discussed getting an EMG of her lower extremities to evaluate for some peripheral cause.  I placed a referral for this as well.  She would like to do this here in [place].  Should she be deemed an appropriate candidate for a formal spinal cord stimulator placement, we would be more than happy to see her back for this.    Thank you for involving me in the care of this patient.   I spent a total of 45 minutes in both face-to-face and non-face-to-face activities for this visit on the date of this encounter review of symptoms, in-depth review of recent MRI, discussion of treatment options, physical exam, and documentation.SABRA [employer] Dept. of Neurosurgery

## 2023-08-29 ENCOUNTER — Ambulatory Visit: Admitting: Neurosurgery

## 2023-08-29 ENCOUNTER — Encounter: Payer: Self-pay | Admitting: Neurosurgery

## 2023-08-29 VITALS — BP 126/72 | Ht 62.0 in | Wt 216.0 lb

## 2023-08-29 DIAGNOSIS — G8929 Other chronic pain: Secondary | ICD-10-CM | POA: Diagnosis not present

## 2023-08-29 DIAGNOSIS — M5441 Lumbago with sciatica, right side: Secondary | ICD-10-CM

## 2023-08-30 ENCOUNTER — Other Ambulatory Visit: Payer: Self-pay

## 2023-08-30 ENCOUNTER — Ambulatory Visit: Admitting: Family Medicine

## 2023-08-30 DIAGNOSIS — E118 Type 2 diabetes mellitus with unspecified complications: Secondary | ICD-10-CM

## 2023-08-30 MED ORDER — METFORMIN HCL 500 MG PO TABS: 500.0000 mg | ORAL_TABLET | Freq: Two times a day (BID) | ORAL | 0 refills | Status: DC

## 2023-08-30 NOTE — Telephone Encounter (Signed)
 Patient presented in office for appointment but was pass the grace period. Patient will reschedule appointment. 30 day of Metformin  has been sent to pharmacy.

## 2023-09-02 ENCOUNTER — Ambulatory Visit (INDEPENDENT_AMBULATORY_CARE_PROVIDER_SITE_OTHER): Admitting: Family Medicine

## 2023-09-02 ENCOUNTER — Encounter: Payer: Self-pay | Admitting: Family Medicine

## 2023-09-02 ENCOUNTER — Telehealth: Payer: Self-pay

## 2023-09-02 VITALS — BP 120/79 | HR 75 | Temp 98.7°F | Resp 18 | Ht 62.0 in | Wt 219.0 lb

## 2023-09-02 DIAGNOSIS — I10 Essential (primary) hypertension: Secondary | ICD-10-CM

## 2023-09-02 DIAGNOSIS — E118 Type 2 diabetes mellitus with unspecified complications: Secondary | ICD-10-CM | POA: Diagnosis not present

## 2023-09-02 DIAGNOSIS — F339 Major depressive disorder, recurrent, unspecified: Secondary | ICD-10-CM

## 2023-09-02 DIAGNOSIS — Z1283 Encounter for screening for malignant neoplasm of skin: Secondary | ICD-10-CM

## 2023-09-02 MED ORDER — METFORMIN HCL 500 MG PO TABS: 500.0000 mg | ORAL_TABLET | Freq: Two times a day (BID) | ORAL | 1 refills | Status: DC

## 2023-09-02 MED ORDER — QUETIAPINE FUMARATE 25 MG PO TABS: 25.0000 mg | ORAL_TABLET | Freq: Every day | ORAL | 0 refills | Status: DC

## 2023-09-02 NOTE — Telephone Encounter (Signed)
(  Key: [id number])  PA Case ID #: [id number]  Rx #: [id number]  Status  Drug QUEtiapine  Fumarate 25MG  tablets  Form [employer] Electronic Prior Authorization Form (2017 NCPDP) Original Claim Info 49 Safety doc req, presc call [phone] override code 11PA Req, Dr [facility] ePA at [email or web address] MD Call [phone], Possible 3 DSSUBMIT 03 Lvl of Service PA# 72 PA TYPE8Drug Requires Prio

## 2023-09-02 NOTE — Assessment & Plan Note (Signed)
 Ask her to get her labs done today but she did not because she has an outstanding balance with [facility].  She is going to work on getting her bill paid and then she will get her labs. Needed to check her renal function because she started losartan  25 mg.  Also need to check routine labs including A1c.

## 2023-09-02 NOTE — Progress Notes (Signed)
 Established Patient Office Visit  Subjective   Patient ID: [name], female    DOB: [birth year]  Age: 58 y.o. MRN: [id number]  Chief Complaint  Patient presents with   Medical Management of Chronic Issues   Insomnia   Cough    Clear - brownish mucus   Mass    Left side of shoulder.     Insomnia  Cough   Delightful 58 year old woman with DMT2 (06/24/2023 A1c 6.4%), HTN, mixed hyperlipidemia, depression, COPD (greater than 30-pyr hx of smoking, 06/25/2022 LDCT: emphysema, scattered solid micronodules, no suspicious pulmonary nodules. FOLLOW-UP in 1 year.), and chronic lumbar pain ([facility]), s/p hemorrhoidectomy (08/03/2011),  cholecystectomy (06/28/2011), s/p TAH, colonoscopy (01/29/2022, TA, FOLLOW-UP 5-7 years).   Discussed the use of AI scribe software for clinical note transcription with the patient, who gave verbal consent to proceed..  She is currently managing her hypertension with losartan  25 mg, amlodipine  10 mg, and hydrochlorothiazide  25mg .  The losartan  is new because she has diabetes and needs renal protection.  She denies any low blood pressures, no orthostatic symptoms.    She is dealing with depression and regularly attends a peer group. Her previous therapist left, and her psychiatrist recently retired. She is on Cymbalta and takes trazodone 100 mg as needed, mostly during the day. Trazodone does not aid her sleep, as she struggles with falling and staying asleep, often waking after one to two hours and staying awake for several hours.    She takes metformin  500 mg twice daily for diabetes management but did not pick up her last prescription and requests a three-month supply.  She reports a swelling on the left side of her neck at her clavicle that has been present for about a month, which she is not overly concerned about. She has noticed new fresh-colored skin spots below the knee that turn into scabs, which she finds concerning. She has never seen a  Dermatologist.  No family history of skin cancer.    She has a history of smoking and is actively working on reducing the number of cigarettes she smokes daily.  She experiences a cough, which she attributes to smoking.    Review of Systems  Respiratory:  Positive for cough.   Psychiatric/Behavioral:  The patient has insomnia.       Objective:     BP 120/79 (BP Location: Left Arm, Patient Position: Sitting, Cuff Size: Normal)   Pulse 75   Temp 98.7 F (37.1 C) (Oral)   Resp 18   Ht 5' 2 (1.575 m)   Wt 219 lb (99.3 kg)   SpO2 91%   BMI 40.06 kg/m    Physical Exam Vitals and nursing note reviewed.  Constitutional:      Appearance: Normal appearance.  HENT:     Head: Normocephalic and atraumatic.  Eyes:     Conjunctiva/sclera: Conjunctivae normal.  Cardiovascular:     Rate and Rhythm: Normal rate and regular rhythm.  Pulmonary:     Effort: Pulmonary effort is normal.     Breath sounds: Normal breath sounds.  Chest:     Comments: Puffiness above the left clavicle medially.   Musculoskeletal:     Right lower leg: No edema.     Left lower leg: No edema.  Skin:    General: Skin is warm and dry.  Neurological:     Mental Status: She is alert and oriented to person, place, and time.  Psychiatric:  Mood and Affect: Mood normal.        Behavior: Behavior normal.        Thought Content: Thought content normal.        Judgment: Judgment normal.          No results found for any visits on 09/02/23.    The 10-year ASCVD risk score (Arnett DK, et al., 2019) is: 12.5%    Assessment & Plan:  Primary hypertension Assessment & Plan: Ask her to get her labs done today but she did not because she has an outstanding balance with [facility].  She is going to work on getting her bill paid and then she will get her labs. Needed to check her renal function because she started losartan  25 mg.  Also need to check routine labs including A1c.  Orders: -     Lipid  panel -     CBC with Differential/Platelet -     Comprehensive metabolic panel with GFR -     Hemoglobin A1c -     Microalbumin / creatinine urine ratio  Controlled type 2 diabetes mellitus with complication, without long-term current use of insulin (HCC) -     metFORMIN  HCl; Take 1 tablet (500 mg total) by mouth 2 (two) times daily with a meal.  Dispense: 180 tablet; Refill: 1 -     Lipid panel -     CBC with Differential/Platelet -     Comprehensive metabolic panel with GFR -     Hemoglobin A1c -     Microalbumin / creatinine urine ratio  Depression, recurrent (HCC) -     QUEtiapine  Fumarate; Take 1 tablet (25 mg total) by mouth at bedtime.  Dispense: 30 tablet; Refill: 0  Assessment and Plan    Depression Experiencing ongoing depression due to a gap in care after previous therapist and psychiatrist left. Currently on Cymbalta and trazodone 100mg  which she takes during the day.  Attends a peer group regularly. Plans to wait for a new psychiatrist at [facility] or consider finding another provider if no progress in a month. - Monitor for new psychiatrist availability at [facility] or seek alternative provider if needed.  Insomnia Difficulty staying asleep and falling asleep despite trazodone 100 mg as needed. Previous psychiatrist suggested Seroquel , which requires time to become effective and may need dosage adjustments. - Prescribe Seroquel  25 mg at bedtime. - Consider increasing dosage every 30 days if needed.  Hypertension Blood pressure is well-controlled on losartan  25 mg, amlodipine  10 mg, and hydrochlorothiazide  25mg  daily. No symptoms of hypotension such as dizziness or lightheadedness.  - Continue losartan  25 mg, amlodipine  10 mg, and hydrochlorothiazide  25mg  daily. - Order blood test to check kidney function and potassium.   Type 2 Diabetes Mellitus On metformin  500 mg twice daily.  Requested a 1-month supply for convenience. - Change metformin  prescription to a 27-month  supply.  Smoker's Cough Cough with phlegm likely related to smoking. No wheezing detected. Has reduced smoking, which may contribute to improvement. - Encourage continued reduction in smoking.  General Health Maintenance Due for routine screenings including LD CT lung scan and mammogram. Previous colonoscopy in 2023 with a 5-year follow-up recommendation. Referral to Dermatology for skin check.  Insurance company has prompted for these screenings, but she has referral.  - Follow up on lung scan and mammogram appointments. -  Referral to Dermatology     Three month Follow-up  Devere MARLA Mock, MD

## 2023-09-11 ENCOUNTER — Ambulatory Visit (INDEPENDENT_AMBULATORY_CARE_PROVIDER_SITE_OTHER): Admitting: Orthopedic Surgery

## 2023-09-11 ENCOUNTER — Encounter: Payer: Self-pay | Admitting: Orthopedic Surgery

## 2023-09-11 VITALS — BP 121/75 | Ht 62.0 in | Wt 217.0 lb

## 2023-09-11 DIAGNOSIS — M25562 Pain in left knee: Secondary | ICD-10-CM

## 2023-09-11 DIAGNOSIS — G8929 Other chronic pain: Secondary | ICD-10-CM

## 2023-09-11 DIAGNOSIS — M25561 Pain in right knee: Secondary | ICD-10-CM | POA: Diagnosis not present

## 2023-09-11 MED ORDER — METHYLPREDNISOLONE ACETATE 40 MG/ML IJ SUSP
40.0000 mg | Freq: Once | INTRAMUSCULAR | Status: AC
  Administered 2023-09-11: 40 mg via INTRAMUSCULAR

## 2023-09-11 NOTE — Patient Instructions (Signed)

## 2023-09-11 NOTE — Progress Notes (Signed)
 New Patient Visit  Assessment: [name] is a 58 y.o. female with the following: Bilateral knee pain; evidence of arthritis on x-ray  Plan: CHELAN HERINGER has pain in both knees.  No specific injury.  Radiographs demonstrate some loss of joint space, as well as osteophytes throughout bilateral knees.  Right is currently bothering her more than the left.  We discussed multiple treatment options.  She is taking appropriate medicines.  She is interested in bilateral knee steroid injections.  These were completed in clinic today.  Depending on the efficacy of the injections, we can consider obtaining an MRI.  She will follow-up as needed.  Procedure note injection Right knee joint   Verbal consent was obtained to inject the right knee joint  Timeout was completed to confirm the site of injection.  The skin was prepped with alcohol and ethyl chloride was sprayed at the injection site.  A 21-gauge needle was used to inject 40 mg of Depo-Medrol  and 1% lidocaine  (4 cc) into the right knee using an anterolateral approach.  There were no complications. A sterile bandage was applied.   Procedure note injection Left knee joint   Verbal consent was obtained to inject the left knee joint  Timeout was completed to confirm the site of injection.  The skin was prepped with alcohol and ethyl chloride was sprayed at the injection site.  A 21-gauge needle was used to inject 40 mg of Depo-Medrol  and 1% lidocaine  (4 cc) into the left knee using an anterolateral approach.  There were no complications. A sterile bandage was applied.     Follow-up: Return if symptoms worsen or fail to improve.  Subjective:  Chief Complaint  Patient presents with   Knee Pain    Pain in bilateral knees  right knee is worse than the left and they both wake her at night. sitting to stand is painful, steps are painful  and can not bend     History of Present Illness: [name] is a 58 y.o. female who  has been referred by Harlene Biles, FNP for evaluation of bilateral knee pain.  She has had bilateral knee pain for several months.  No specific injury.  Right is worse than left.  She has pain in the medial aspect of bilateral knees, and this gets worse at night.  She is difficulty standing from a seated position.  She takes chronic narcotics.  She is also taking ibuprofen.  She states that she had injections in both knees 6-7 months ago.  This provided no relief of her symptoms.  She denies mechanical symptoms.  Occasional swelling.   Review of Systems: No fevers or chills No numbness or tingling No chest pain No shortness of breath No bowel or bladder dysfunction No GI distress No headaches   Medical History:  Past Medical History:  Diagnosis Date   Anxiety    Depression    Hypertension    Lumbar spondylolysis    Sacroiliac joint pain     Past Surgical History:  Procedure Laterality Date   ABDOMINAL HYSTERECTOMY     APPENDECTOMY     CESAREAN SECTION     X2   CHOLECYSTECTOMY     COLONOSCOPY WITH PROPOFOL  N/A 01/29/2022   Procedure: COLONOSCOPY WITH PROPOFOL ;  Surgeon: Unk Corinn Skiff, MD;  Location: [facility] ENDOSCOPY;  Service: Gastroenterology;  Laterality: N/A;    History reviewed. No pertinent family history. Social History   Tobacco Use   Smoking status: Every Day  Types: Cigarettes    Passive exposure: Current   Smokeless tobacco: Never  Vaping Use   Vaping status: Never Used  Substance Use Topics   Alcohol use: Yes   Drug use: No    Allergies  Allergen Reactions   Hydrocodone Anaphylaxis   Tramadol  Hives   Hydrocodone-Acetaminophen  Rash    Current Meds  Medication Sig   ACCU-CHEK GUIDE TEST test strip 1 EACH BY IN VITRO ROUTE IN THE MORNING, AT NOON, AND AT BEDTIME. MAY SUBSTITUTE TO ANY MANUFACTURER COVERED BY PATIENT'S INSURANCE.   Accu-Chek Softclix Lancets lancets SMARTSIG:Topical   albuterol  (PROVENTIL ) (2.5 MG/3ML) 0.083% nebulizer  solution Take 3 mLs (2.5 mg total) by nebulization every 4 (four) hours as needed for wheezing or shortness of breath (please include nebulizer machine, hoses, and mask if needed.).   albuterol  (VENTOLIN  HFA) 108 (90 Base) MCG/ACT inhaler Inhale 2 puffs into the lungs every 4 (four) hours as needed for wheezing or shortness of breath.   amLODipine  (NORVASC ) 10 MG tablet Take 1 tablet (10 mg total) by mouth daily.   atorvastatin  (LIPITOR) 20 MG tablet Take 20 mg by mouth daily.   Blood Glucose Monitoring Suppl DEVI 1 each by Does not apply route in the morning, at noon, and at bedtime. May substitute to any manufacturer covered by patient's insurance.   budesonide -formoterol  (SYMBICORT ) 160-4.5 MCG/ACT inhaler Inhale 2 puffs into the lungs 2 (two) times daily.   DULoxetine (CYMBALTA) 60 MG capsule Take 90 mg by mouth daily.   gabapentin (NEURONTIN) 600 MG tablet 1 capsule Orally four times daily for 30 days   hydrochlorothiazide  (HYDRODIURIL ) 25 MG tablet Take 1 tablet (25 mg total) by mouth daily.   losartan  (COZAAR ) 25 MG tablet Take 1 tablet (25 mg total) by mouth daily.   metFORMIN  (GLUCOPHAGE ) 500 MG tablet Take 1 tablet (500 mg total) by mouth 2 (two) times daily with a meal.   oxyCODONE -acetaminophen  (PERCOCET) 10-325 MG tablet Take 1 tablet by mouth 4 (four) times daily as needed.   pantoprazole  (PROTONIX ) 40 MG tablet Take 1 tablet (40 mg total) by mouth daily.   QUEtiapine  (SEROQUEL ) 25 MG tablet Take 1 tablet (25 mg total) by mouth at bedtime.   traZODone (DESYREL) 100 MG tablet Take 100 mg by mouth at bedtime as needed.    Objective: BP 121/75   Ht 5' 2 (1.575 m)   Wt 217 lb (98.4 kg)   BMI 39.69 kg/m   Physical Exam:  General: Alert and oriented. and No acute distress. Gait: Slow, steady gait.  Bilateral knees without swelling.  No bruising.  Tenderness palpation along the medial joint line.  Good range of motion.  Mild crepitus with range of motion testing.  Pain but no  laxity to valgus stress.  Negative Lachman.  IMAGING: I personally reviewed images previously obtained in clinic  X-rays of bilateral knees were available in clinic today.  There is mild loss of joint space throughout bilateral knees.  However, there are signs of arthritis including associated osteophytes.  No bony lesions.   New Medications:  Meds ordered this encounter  Medications   methylPREDNISolone  acetate (DEPO-MEDROL ) injection 40 mg   methylPREDNISolone  acetate (DEPO-MEDROL ) injection 40 mg      Oneil DELENA Horde, MD  09/11/2023 4:49 PM

## 2023-09-12 ENCOUNTER — Ambulatory Visit: Admitting: Orthopedic Surgery

## 2023-09-19 ENCOUNTER — Telehealth: Payer: Self-pay | Admitting: Family Medicine

## 2023-09-19 NOTE — Telephone Encounter (Signed)
 Copied from CRM #[id number]. Topic: Appointments - Scheduling Inquiry for Clinic >> Sep 19, 2023  8:57 AM Larissa RAMAN wrote: Reason for CRM: Patient requesting lab appointment. Please contact for scheduling.  Spoke with patient, she ill walk in for labs. M-F 8-5 closed lunch 12-1

## 2023-09-24 ENCOUNTER — Other Ambulatory Visit: Payer: Self-pay | Admitting: Family Medicine

## 2023-09-24 DIAGNOSIS — F339 Major depressive disorder, recurrent, unspecified: Secondary | ICD-10-CM

## 2023-09-30 ENCOUNTER — Ambulatory Visit (INDEPENDENT_AMBULATORY_CARE_PROVIDER_SITE_OTHER)

## 2023-09-30 DIAGNOSIS — D3610 Benign neoplasm of peripheral nerves and autonomic nervous system, unspecified: Secondary | ICD-10-CM

## 2023-09-30 DIAGNOSIS — L820 Inflamed seborrheic keratosis: Secondary | ICD-10-CM

## 2023-09-30 DIAGNOSIS — L918 Other hypertrophic disorders of the skin: Secondary | ICD-10-CM | POA: Diagnosis not present

## 2023-09-30 DIAGNOSIS — D485 Neoplasm of uncertain behavior of skin: Secondary | ICD-10-CM

## 2023-09-30 DIAGNOSIS — L281 Prurigo nodularis: Secondary | ICD-10-CM | POA: Diagnosis not present

## 2023-09-30 DIAGNOSIS — Z79899 Other long term (current) drug therapy: Secondary | ICD-10-CM

## 2023-09-30 DIAGNOSIS — L280 Lichen simplex chronicus: Secondary | ICD-10-CM | POA: Diagnosis not present

## 2023-09-30 MED ORDER — DUPIXENT 300 MG/2ML ~~LOC~~ SOAJ: 600.0000 mg | Freq: Once | SUBCUTANEOUS | 0 refills | Status: AC

## 2023-09-30 MED ORDER — CLOBETASOL PROPIONATE 0.05 % EX CREA: TOPICAL_CREAM | CUTANEOUS | 1 refills | Status: DC

## 2023-09-30 MED ORDER — DUPIXENT 300 MG/2ML ~~LOC~~ SOAJ
300.0000 mg | SUBCUTANEOUS | 5 refills | Status: AC
Start: 2023-09-30 — End: ?

## 2023-09-30 NOTE — Progress Notes (Signed)
 New Patient Visit   Subjective  [name] is a 58 y.o. female who presents for the following: Pt c/o sores all over the body x 3 years that start as a little red spots, then lesion scabs over, and some occasionally have pus. Pt currently using H2O2 and Neosporin and covers with bandages, but then the bandage breaks her skin out, pt does pick at lesions. Pt c/o lesion behind the L ear x 3 years, growing in size that she would like removed today. Lesion on the L upper post arm, irritating, will not resolve, has been there for about 2 years. Pt c/o skin tags around the neck that are irritating and she would like removed. No personal or family hx of skin cancer.  The following portions of the chart were reviewed this encounter and updated as appropriate: medications, allergies, medical history  Review of Systems:  No other skin or systemic complaints except as noted in HPI or Assessment and Plan.  Objective  Well appearing patient in no apparent distress; mood and affect are within normal limits.   A focused examination was performed of the following areas:   Relevant exam findings are noted in the Assessment and Plan.  L post auricular 0.5 cm pink papule.   L upper arm post x 1 Brown stuck on papule.   arms and legs with pink dome shape papules, some crusted/eroded, some evidence of depigmented scaring on arms/legs/chest .  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 Condition is symptomatic/ bothersome to patient. Not currently at goal.  IGA Stage 3, IGA activity 3 - Discussed diagnosis, typical course, and treatment options for this condition - Discussed itch scratch cycle and risk of kobenerization - Advised against excoriation (discussed itch-scratch cycle), keep nails short -  Mosturization with Eucerin or Vaseline (purchase Cerave if desired), lukewarm showers, Dove sensitive skin soap to axilla/groin - Start clobetasol  0.05% ointment to nodules up to BID - Start dupilumab  - 600mg  subcutaneously on day 0, then 300mg  every other week starting day 14 - 30-60kg: 400mg  subcutaneously on day 0, then 200mg  qOweek starting day 14 - 15-30kg: 600mg  subcutaneously on day 0, then 300mg  q4week starting day 28 - <15kg: 400mg  subcutaneously on day 0, then 200mg  q4week starting day 14 - Under age 67 only approved for syringe not pen - Side effects reviewed at length: injection site reactions, nasopharyngitis/conjunctivitis/URI, headache, exacerbation of atopic dermatitis, parasitic infection (be careful when traveling or camping) - Discussed needs to have container for sharps and proper disposal - Potential side effects include allergic reaction, herpes infections, injection site reactions and conjunctivitis (inflammation of the eyes).  The use of Dupixent  requires long term medication management, including periodic office visits.  Avoid picking/rubbing/scratching NEOPLASM OF UNCERTAIN BEHAVIOR OF SKIN L post auricular Epidermal / dermal shaving  Lesion diameter (cm):  0.5 Informed consent: discussed and consent obtained   Timeout: patient name, date of birth, surgical site, and procedure verified   Procedure prep:  Patient was prepped and draped in usual sterile fashion Prep type:  Isopropyl alcohol Anesthesia: the lesion was anesthetized in a standard fashion   Anesthetic:  1% lidocaine  w/ epinephrine 1-100,000 buffered w/ 8.4% NaHCO3 Instrument used: flexible razor blade   Hemostasis achieved with: pressure, aluminum chloride and electrodesiccation  Outcome: patient tolerated procedure well   Post-procedure details: sterile dressing applied and wound care instructions given   Dressing type: bandage (Mupirocin 2% ointment)    Specimen 1 - Surgical pathology Differential  Diagnosis: D48.5 r/o dermal nevus vs neurofibroma Check Margins: No INFLAMED SEBORRHEIC KERATOSIS L upper arm post x 1 Destruction of lesion - L upper arm post x 1 Complexity: simple   Destruction method: cryotherapy   Informed consent: discussed and consent obtained   Timeout:  patient name, date of birth, surgical site, and procedure verified Lesion destroyed using liquid nitrogen: Yes   Region frozen until ice ball extended beyond lesion: Yes   Outcome: patient tolerated procedure well with no complications   Post-procedure details: wound care instructions given     Acrochordons (Skin Tags) - Fleshy, skin-colored pedunculated papules - Benign appearing.  - Observe. - If desired, they can be removed with an in office procedure that is not covered by insurance. - Please call the clinic if you notice any new or changing lesions.   Return for prurigo nodularis follow up and skin tag removal in 6-8 wks.  LILLETTE Rosina Mayans, CMA, am acting as scribe for Lauraine JAYSON Kanaris, MD .  Documentation: I have reviewed the above documentation for accuracy and completeness, and I agree with the above.  Lauraine JAYSON Kanaris, MD

## 2023-09-30 NOTE — Patient Instructions (Signed)

## 2023-10-02 ENCOUNTER — Ambulatory Visit: Admitting: Family Medicine

## 2023-10-04 LAB — SURGICAL PATHOLOGY

## 2023-10-07 ENCOUNTER — Ambulatory Visit: Payer: Self-pay

## 2023-10-07 NOTE — Telephone Encounter (Signed)
 Patient informed of pathology results

## 2023-10-07 NOTE — Telephone Encounter (Signed)
-----   Message from Lauraine JAYSON Kanaris sent at 10/07/2023  8:52 AM EDT -----      1. Skin, L post auricular :       NEUROFIBROMA  Please notify of benign lesions. No further treatment.   ----- Message ----- From: Interface, Lab In Three Zero One Sent: 10/04/2023   9:17 AM EDT To: Lauraine JAYSON Kanaris, MD

## 2023-10-09 ENCOUNTER — Encounter: Payer: Self-pay | Admitting: Family Medicine

## 2023-10-09 ENCOUNTER — Ambulatory Visit (INDEPENDENT_AMBULATORY_CARE_PROVIDER_SITE_OTHER): Admitting: Family Medicine

## 2023-10-09 VITALS — BP 117/78 | HR 93 | Temp 98.2°F | Ht 62.0 in | Wt 216.4 lb

## 2023-10-09 DIAGNOSIS — E559 Vitamin D deficiency, unspecified: Secondary | ICD-10-CM | POA: Diagnosis not present

## 2023-10-09 DIAGNOSIS — Z23 Encounter for immunization: Secondary | ICD-10-CM | POA: Diagnosis not present

## 2023-10-09 DIAGNOSIS — Z122 Encounter for screening for malignant neoplasm of respiratory organs: Secondary | ICD-10-CM

## 2023-10-09 DIAGNOSIS — E782 Mixed hyperlipidemia: Secondary | ICD-10-CM | POA: Diagnosis not present

## 2023-10-09 DIAGNOSIS — E118 Type 2 diabetes mellitus with unspecified complications: Secondary | ICD-10-CM

## 2023-10-09 DIAGNOSIS — M5416 Radiculopathy, lumbar region: Secondary | ICD-10-CM

## 2023-10-09 LAB — POCT GLYCOSYLATED HEMOGLOBIN (HGB A1C): Hemoglobin A1C: 6.4 % — AB (ref 4.0–5.6)

## 2023-10-09 NOTE — Progress Notes (Unsigned)
   Established Patient Office Visit  Subjective   Patient ID: [name], female    DOB: 1965-09-19  Age: 58 y.o. MRN: [id number]  Chief Complaint  Patient presents with   Follow-up    HPI Delightful 58 year old woman with DMT2 (06/24/2023 A1c 6.4%), HTN, mixed hyperlipidemia, depression, COPD (greater than 30-pyr hx of smoking, 06/25/2022 LDCT: emphysema, scattered solid micronodules, no suspicious pulmonary nodules. FOLLOW-UP in 1 year.), and chronic lumbar pain ([facility]), s/p hemorrhoidectomy (08/03/2011), cholecystectomy (06/28/2011), s/p TAH, colonoscopy (01/29/2022, TA, FOLLOW-UP 5-7 years).    {History (Optional):23778}  ROS    Objective:     BP 117/78   Pulse 93   Temp 98.2 F (36.8 C) (Oral)   Ht 5' 2 (1.575 m)   Wt 216 lb 6 oz (98.1 kg)   SpO2 94%   BMI 39.58 kg/m  {Vitals History (Optional):23777}  Physical Exam ***  {Perform Simple Foot Exam  Perform Detailed exam:1} {Insert foot Exam (Optional):30965}   Results for orders placed or performed in visit on 10/09/23  POCT HgB A1C  Result Value Ref Range   Hemoglobin A1C 6.4 (A) 4.0 - 5.6 %   HbA1c POC (<> result, manual entry)     HbA1c, POC (prediabetic range)     HbA1c, POC (controlled diabetic range)      {Labs (Optional):23779}  The 10-year ASCVD risk score (Arnett DK, et al., 2019) is: 11.9%    Assessment & Plan:  Controlled type 2 diabetes mellitus with complication, without long-term current use of insulin (HCC) -     POCT glycosylated hemoglobin (Hb A1C) -     Lipid panel -     CBC with Differential/Platelet -     Comprehensive metabolic panel with GFR -     Microalbumin / creatinine urine ratio  Vitamin D  deficiency -     VITAMIN D  25 Hydroxy (Vit-D Deficiency, Fractures)     Return in about 3 months (around 01/09/2024).    Senita Corredor K Evie Croston, MD

## 2023-10-10 DIAGNOSIS — Z122 Encounter for screening for malignant neoplasm of respiratory organs: Secondary | ICD-10-CM | POA: Insufficient documentation

## 2023-10-10 DIAGNOSIS — Z23 Encounter for immunization: Secondary | ICD-10-CM | POA: Insufficient documentation

## 2023-10-10 DIAGNOSIS — M5416 Radiculopathy, lumbar region: Secondary | ICD-10-CM | POA: Insufficient documentation

## 2023-10-10 LAB — COMPREHENSIVE METABOLIC PANEL WITH GFR
ALT: 9 IU/L (ref 0–32)
AST: 10 IU/L (ref 0–40)
Albumin: 4.1 g/dL (ref 3.8–4.9)
Alkaline Phosphatase: 117 IU/L (ref 44–121)
BUN/Creatinine Ratio: 17 (ref 9–23)
BUN: 12 mg/dL (ref 6–24)
Bilirubin Total: <0.2 mg/dL (ref 0.0–1.2)
CO2: 27 mmol/L (ref 20–29)
Calcium: 9.5 mg/dL (ref 8.7–10.2)
Chloride: 100 mmol/L (ref 96–106)
Creatinine, Ser: 0.72 mg/dL (ref 0.57–1.00)
Globulin, Total: 2.2 g/dL (ref 1.5–4.5)
Glucose: 162 mg/dL — ABNORMAL HIGH (ref 70–99)
Potassium: 3.5 mmol/L (ref 3.5–5.2)
Sodium: 144 mmol/L (ref 134–144)
Total Protein: 6.3 g/dL (ref 6.0–8.5)
eGFR: 98 mL/min/1.73 (ref >59)

## 2023-10-10 LAB — CBC WITH DIFFERENTIAL/PLATELET
Basophils Absolute: 0.1 x10E3/uL (ref 0.0–0.2)
Basos: 1 %
EOS (ABSOLUTE): 0.4 x10E3/uL (ref 0.0–0.4)
Eos: 4 %
Hematocrit: 41.4 % (ref 34.0–46.6)
Hemoglobin: 13.4 g/dL (ref 11.1–15.9)
Immature Grans (Abs): 0 x10E3/uL (ref 0.0–0.1)
Immature Granulocytes: 0 %
Lymphocytes Absolute: 2.3 x10E3/uL (ref 0.7–3.1)
Lymphs: 21 %
MCH: 29.8 pg (ref 26.6–33.0)
MCHC: 32.4 g/dL (ref 31.5–35.7)
MCV: 92 fL (ref 79–97)
Monocytes Absolute: 0.6 x10E3/uL (ref 0.1–0.9)
Monocytes: 5 %
Neutrophils Absolute: 7.7 x10E3/uL — ABNORMAL HIGH (ref 1.4–7.0)
Neutrophils: 69 %
Platelets: 374 x10E3/uL (ref 150–450)
RBC: 4.49 x10E6/uL (ref 3.77–5.28)
RDW: 13.2 % (ref 11.7–15.4)
WBC: 11.1 x10E3/uL — ABNORMAL HIGH (ref 3.4–10.8)

## 2023-10-10 LAB — MICROALBUMIN / CREATININE URINE RATIO
Creatinine, Urine: 168.2 mg/dL
Microalb/Creat Ratio: 15 mg/g{creat} (ref 0–29)
Microalbumin, Urine: 25.1 ug/mL

## 2023-10-10 LAB — LIPID PANEL
Chol/HDL Ratio: 2.9 ratio (ref 0.0–4.4)
Cholesterol, Total: 146 mg/dL (ref 100–199)
HDL: 50 mg/dL (ref >39)
LDL Chol Calc (NIH): 73 mg/dL (ref 0–99)
Triglycerides: 129 mg/dL (ref 0–149)
VLDL Cholesterol Cal: 23 mg/dL (ref 5–40)

## 2023-10-10 LAB — VITAMIN D 25 HYDROXY (VIT D DEFICIENCY, FRACTURES): Vit D, 25-Hydroxy: 12.6 ng/mL — ABNORMAL LOW (ref 30.0–100.0)

## 2023-10-10 NOTE — Assessment & Plan Note (Signed)
 Doing very well with diet and metformin  500 mg twice daily.  Does check her feet.  A1c today 6.4%.  Continue same medicine and please take an 81 mg aspirin daily.  Also ask you to see the eye doctor.

## 2023-10-10 NOTE — Assessment & Plan Note (Signed)
 She is frustrated about the situation with her back because she is in chronic pain.  She takes Percocet daily.  She has an appointment with a neurosurgeon and I encouraged her to follow-up with this.

## 2023-10-10 NOTE — Assessment & Plan Note (Signed)
 He is interested in getting a low-dose CT scan of the chest.

## 2023-10-10 NOTE — Assessment & Plan Note (Signed)
 She has not had the Shingrix vaccine but is interested in getting it.  Shingrix No. 1 today.  Will give you a booster at your next appointment.

## 2023-10-10 NOTE — Assessment & Plan Note (Signed)
 Checking CMP and lipid panel today.  Goal is LDL 70 or less because she has diabetes.

## 2023-10-16 ENCOUNTER — Other Ambulatory Visit: Payer: Self-pay | Admitting: Family Medicine

## 2023-10-16 ENCOUNTER — Ambulatory Visit: Payer: Self-pay | Admitting: Family Medicine

## 2023-10-16 DIAGNOSIS — E559 Vitamin D deficiency, unspecified: Secondary | ICD-10-CM

## 2023-10-16 MED ORDER — VITAMIN D (ERGOCALCIFEROL) 1.25 MG (50000 UNIT) PO CAPS: 50000.0000 [IU] | ORAL_CAPSULE | ORAL | 1 refills | Status: AC

## 2023-10-18 ENCOUNTER — Telehealth: Payer: Self-pay | Admitting: Orthopedic Surgery

## 2023-10-18 NOTE — Telephone Encounter (Signed)
 Dr. Onesimo pt ([place]) - bil knees, still having pain, injection didn't help much.  She wants to be seen in [place] sooner than 9/25, willing to see someone else.  [phone]

## 2023-10-18 NOTE — Telephone Encounter (Signed)
 I called and scheduled sooner appt

## 2023-10-22 ENCOUNTER — Other Ambulatory Visit: Payer: Self-pay

## 2023-10-22 DIAGNOSIS — G8929 Other chronic pain: Secondary | ICD-10-CM

## 2023-10-23 ENCOUNTER — Ambulatory Visit: Admission: RE | Admit: 2023-10-23 | Discharge: 2023-10-23 | Disposition: A | Discharge status: Home / Self Care

## 2023-10-23 ENCOUNTER — Ambulatory Visit
Admission: RE | Admit: 2023-10-23 | Discharge: 2023-10-23 | Disposition: A | Discharge status: Home / Self Care | Source: Ambulatory Visit

## 2023-10-23 DIAGNOSIS — M25562 Pain in left knee: Secondary | ICD-10-CM | POA: Insufficient documentation

## 2023-10-23 DIAGNOSIS — M25561 Pain in right knee: Secondary | ICD-10-CM | POA: Diagnosis present

## 2023-10-23 DIAGNOSIS — G8929 Other chronic pain: Secondary | ICD-10-CM | POA: Insufficient documentation

## 2023-10-23 NOTE — Progress Notes (Deleted)
 Visit Reason: Duration of symptoms: Occupation: Diabetic: {yes/no:20286} Smoking: {yes/no:20286} Heart/Lung History: Blood Thinners:   Prior Testing/EMG: Injections (Date): Treatments: Prior Surgery:

## 2023-10-23 NOTE — Telephone Encounter (Signed)
 Patients insurance is denying Dupixent  therapy due to the patient not trying and failing a topical calcineurin inhibitor. Please advise?

## 2023-10-24 MED ORDER — TACROLIMUS 0.1 % EX OINT: TOPICAL_OINTMENT | Freq: Two times a day (BID) | CUTANEOUS | 1 refills | Status: AC

## 2023-10-24 NOTE — Addendum Note (Signed)
 Addended by: TERESA PALMA R on: 10/24/2023 09:16 AM   Modules accepted: Orders

## 2023-10-24 NOTE — Telephone Encounter (Signed)
 Tacrolimus  Ointment sent in and patient scheduled to start samples next week. aw

## 2023-10-25 ENCOUNTER — Ambulatory Visit (INDEPENDENT_AMBULATORY_CARE_PROVIDER_SITE_OTHER)

## 2023-10-25 DIAGNOSIS — M170 Bilateral primary osteoarthritis of knee: Secondary | ICD-10-CM

## 2023-10-25 DIAGNOSIS — G8929 Other chronic pain: Secondary | ICD-10-CM

## 2023-10-25 DIAGNOSIS — M25562 Pain in left knee: Secondary | ICD-10-CM

## 2023-10-25 DIAGNOSIS — M25561 Pain in right knee: Secondary | ICD-10-CM

## 2023-10-25 NOTE — Progress Notes (Signed)
 Orthopaedic Surgery New Knee Visit   History of Present Illness: The patient is a 58 y.o. female seen in clinic for Bilateral knee pain. Patient reports symptom onset was gradual, and started several months ago.  Associated symptoms include instability and clicking.  This complaint is not work-related.  Patient is already established with the practice, already having been seen by one of my partners at the end of July.  She has undergone conservative workup and treatment modalities including over-the-counter oral pain medication, a series of intra-articular steroid injections with minimal relief, as well as formal physical therapy.  She requested to be seen sooner than her prior follow-up appointment due to her symptoms.  Pain is located primarily over the anterior, medial and lateral aspects of bilateral knees and constant in nature. Patient describes the pain as sharp  Pain is aggravated by ambulation, movement, sitting, and standing.  Pain is relieved by nothing to date.  The patient is functionally limited in work/home/recreational activities as a result of the symptoms.      Treatments attempted: NSAID's and/or analgesics and corticosteriod injections, physical therapy     Family present: none.  Occupation: Retired Activities: Walking every day   Knee Surgical History: none  Past Medical, Social and Family History: Past Medical History:  Diagnosis Date   Anxiety    Depression    Hypertension    Lumbar spondylolysis    Sacroiliac joint pain    Past Surgical History:  Procedure Laterality Date   ABDOMINAL HYSTERECTOMY     APPENDECTOMY     CESAREAN SECTION     X2   CHOLECYSTECTOMY     COLONOSCOPY WITH PROPOFOL  N/A 01/29/2022   Procedure: COLONOSCOPY WITH PROPOFOL ;  Surgeon: Unk Corinn Skiff, MD;  Location: [facility] ENDOSCOPY;  Service: Gastroenterology;  Laterality: N/A;   Allergies  Allergen Reactions   Hydrocodone Anaphylaxis   Tramadol  Hives    Hydrocodone-Acetaminophen  Rash   Current Outpatient Medications on File Prior to Visit  Medication Sig Dispense Refill   ACCU-CHEK GUIDE TEST test strip 1 EACH BY IN VITRO ROUTE IN THE MORNING, AT NOON, AND AT BEDTIME. MAY SUBSTITUTE TO ANY MANUFACTURER COVERED BY PATIENT'S INSURANCE. 100 strip 0   Accu-Chek Softclix Lancets lancets SMARTSIG:Topical     albuterol  (PROVENTIL ) (2.5 MG/3ML) 0.083% nebulizer solution Take 3 mLs (2.5 mg total) by nebulization every 4 (four) hours as needed for wheezing or shortness of breath (please include nebulizer machine, hoses, and mask if needed.). 360 mL 6   albuterol  (VENTOLIN  HFA) 108 (90 Base) MCG/ACT inhaler Inhale 2 puffs into the lungs every 4 (four) hours as needed for wheezing or shortness of breath. 18 g 11   amLODipine  (NORVASC ) 10 MG tablet Take 1 tablet (10 mg total) by mouth daily. 90 tablet 3   atorvastatin  (LIPITOR) 20 MG tablet Take 20 mg by mouth daily.     Blood Glucose Monitoring Suppl DEVI 1 each by Does not apply route in the morning, at noon, and at bedtime. May substitute to any manufacturer covered by patient's insurance. 1 each 0   budesonide -formoterol  (SYMBICORT ) 160-4.5 MCG/ACT inhaler Inhale 2 puffs into the lungs 2 (two) times daily. 6 g 11   clobetasol  cream (TEMOVATE ) 0.05 % Apply to aa's QD-BID PRN. Avoid applying to face, groin, and axilla. Use as directed. Long-term use can cause thinning of the skin. 60 g 1   diclofenac Sodium (VOLTAREN) 1 % GEL as directed Transdermal two times per day for 30 days     DULoxetine (  CYMBALTA) 60 MG capsule Take 90 mg by mouth daily.     Dupilumab  (DUPIXENT ) 300 MG/2ML SOAJ Inject 300 mg into the skin every 14 (fourteen) days. Starting at day 15 for maintenance. 4 mL 5   gabapentin (NEURONTIN) 600 MG tablet 1 capsule Orally four times daily for 30 days     hydrochlorothiazide  (HYDRODIURIL ) 25 MG tablet Take 1 tablet (25 mg total) by mouth daily. 90 tablet 1   losartan  (COZAAR ) 25 MG tablet Take 1  tablet (25 mg total) by mouth daily. 90 tablet 1   metFORMIN  (GLUCOPHAGE ) 500 MG tablet Take 1 tablet (500 mg total) by mouth 2 (two) times daily with a meal. 180 tablet 1   oxyCODONE -acetaminophen  (PERCOCET) 10-325 MG tablet Take 1 tablet by mouth 4 (four) times daily as needed.     pantoprazole  (PROTONIX ) 40 MG tablet Take 1 tablet (40 mg total) by mouth daily. 90 tablet 1   QUEtiapine  (SEROQUEL ) 25 MG tablet TAKE 1 TABLET BY MOUTH EVERYDAY AT BEDTIME 90 tablet 1   tacrolimus  (PROTOPIC ) 0.1 % ointment Apply topically 2 (two) times daily. 60 g 1   traZODone (DESYREL) 100 MG tablet Take 100 mg by mouth at bedtime as needed.     Vitamin D , Ergocalciferol , (DRISDOL ) 1.25 MG (50000 UNIT) CAPS capsule Take 1 capsule (50,000 Units total) by mouth every 7 (seven) days. 13 capsule 1   No current facility-administered medications on file prior to visit.   Social History   Tobacco Use   Smoking status: Every Day    Types: Cigarettes    Passive exposure: Current   Smokeless tobacco: Never  Vaping Use   Vaping status: Never Used  Substance Use Topics   Alcohol use: Yes   Drug use: No      I have reviewed past medical, surgical, social and family history, medications and allergies as documented in the EMR.  Review of Systems - A 7 point review of systems was performed and was negative with the exception of the above HPI.     Physical Exam:  General/Constitutional: NAD Vascular: No edema, swelling or tenderness, except as noted in detailed exam Integumentary: No impressive skin lesions present, except as noted in detailed exam Neuro/Psych: Normal mood and affect, oriented to person, place and time Musculoskeletal: Normal, except as noted in detailed exam and in HPI  Knee Examination (focused):   RIGHT LEFT  AROM (degrees) PROM (degrees)  0-120 0-120 0-120 0-120  Palpation (pain): Effusion none none   Medial joint line tenderness positive positive   Lateral joint line tenderness  positive positive  Instability: Varus @ 0 degrees            @ 30 degrees none none none none   Valgus @ 0 degrees             @ 30 degrees none none none none  Special Tests: Lachman's negative negative   Posterior drawer none none   Anterior drawer none none   Pivot shift   deferred deferred   McMurray negative negative   Dial @ 30 degrees         @ 90 degrees deferred deferred deferred deferred  Patella: Palpation (pain) negative negative   Mobility < 2 quadrants < 2 quadrants   Apprehension deferred deferred  Other: Knee flexion strength   5/5  5/5   Knee extension strength  5/5  5/5    Vascular/Lymphatic: 2+ dorsalis pedis pulse,  foot warm and well perfused Neurologic: Sensation  intact to light touch to Superficial peroneal/Deep peroneal/Tibial/Sural/Saphenous nerves  XR Knee Imaging: X-rays of the Bilateral knee including AP/lateral/sunrise/PA notch views obtained at [facility] on 10/23/23 were reviewed personally by me.  Per my independent interpretation these images show mild to moderated joint space narrowing most pronounced in the medial compartments bilaterally.  Osteophyte formation also noted throughout all compartments.  Mild subchondral sclerosis also noted in bilateral medial compartments.   Assessment:   Bilateral knee osteoarthritis  Plan: Patient was seen and examined in office today. We reviewed patient's history, examination, and imaging.  Based on information provided as well as previous treatment measures, patient continues to be symptomatic from her bilateral knee osteoarthritis.  Both knees seem to bother her equally at different times.  She has exhausted several conservative treatment measures to date including physical therapy, oral nonsteroidal anti-inflammatory medication and intra-articular cortisone injections x3.  We did review other conservative measures, specifically topical NSAIDs and viscosupplementation injections.  Patient will obtain  topical NSAID over-the-counter.  Advised to stop taking ibuprofen while utilizing topical medication.  She would like to think about viscosupplementation and will notify the office if she would like to proceed- we have ordered the medication in anticipation that she will proceed.   We may consider pain management referral in the future.  All questions, concerns and comments were addressed to the best of my ability.  Follow up in 6 weeks or sooner if needed  Arlyss GEANNIE Schneider, DO Orthopedic Surgery & Sports Medicine [facility]

## 2023-10-25 NOTE — Patient Instructions (Addendum)
 [facility] [address], [place] [address] [phone]   Thank you for visiting the office today. We appreciate your trust and allowing us  to help you with your orthopedic needs.  *Please stop taking Ibuprofen while using Voltaren gel.* Please follow dosing instructions on box.  Please do not hesitate to call if you have further questions or concerns following your visit with us . If you experience life-threatening symptoms or it cannot wait until normal office hours, please go to the nearest Emergency Department for immediate evaluation.

## 2023-10-29 ENCOUNTER — Ambulatory Visit (INDEPENDENT_AMBULATORY_CARE_PROVIDER_SITE_OTHER)

## 2023-10-29 DIAGNOSIS — L281 Prurigo nodularis: Secondary | ICD-10-CM

## 2023-10-29 MED ORDER — DUPILUMAB 300 MG/2ML ~~LOC~~ SOAJ
300.0000 mg | Freq: Once | SUBCUTANEOUS | Status: AC
  Administered 2023-10-29: 300 mg via SUBCUTANEOUS

## 2023-10-29 NOTE — Progress Notes (Signed)
 Patient here today to start Dupixent  injections for prurigo nodularis.   Dupixent  600mg  auto injections injected into left and right arm. Patient tolerated injections well.   LOT: [id number] EXP: 02/11/25  Alan Pizza, RMA

## 2023-11-04 ENCOUNTER — Telehealth: Payer: Self-pay

## 2023-11-04 DIAGNOSIS — F17219 Nicotine dependence, cigarettes, with unspecified nicotine-induced disorders: Secondary | ICD-10-CM

## 2023-11-04 DIAGNOSIS — Z1231 Encounter for screening mammogram for malignant neoplasm of breast: Secondary | ICD-10-CM

## 2023-11-04 NOTE — Telephone Encounter (Signed)
 Imaging referrals sent to requested location.   [facility] [address] [place], [place] [address] [phone] (ph) [phone]

## 2023-11-04 NOTE — Telephone Encounter (Signed)
 Insurance is denying Dupixent  PA at this time. She has to tried and fail a topical calcineurin inhibitor.  If you want to send something in, we can have her use it until her follow up with you on 11/25/23 and pick up appeal from that office visit? Also, we can continue to use samples at this time, if you are okay with that?

## 2023-11-04 NOTE — Telephone Encounter (Signed)
 Copied from CRM [id number]. Topic: General - Other >> Nov 04, 2023  1:33 PM Rosaria E wrote: Reason for CRM: Pt called reporting that she needs her CT Lung Cancer screening and Mammogram sent to:  [facility] [place]  [phone]

## 2023-11-12 ENCOUNTER — Ambulatory Visit

## 2023-11-12 DIAGNOSIS — L281 Prurigo nodularis: Secondary | ICD-10-CM | POA: Diagnosis not present

## 2023-11-12 MED ORDER — DUPILUMAB 300 MG/2ML ~~LOC~~ SOSY
300.0000 mg | PREFILLED_SYRINGE | Freq: Once | SUBCUTANEOUS | Status: AC
  Administered 2023-11-12: 300 mg via SUBCUTANEOUS

## 2023-11-12 NOTE — Progress Notes (Signed)
 Patient here today to start Dupixent  injections for prurigo nodularis.    Dupixent  300 syringe injected into right arm. Patient tolerated injections well.    LOT: [id number] EXP: 06/12/2025   Alan Pizza, RMA

## 2023-11-18 ENCOUNTER — Ambulatory Visit

## 2023-11-20 ENCOUNTER — Telehealth: Payer: Self-pay

## 2023-11-20 NOTE — Telephone Encounter (Signed)
 I called and advised pt of the above. She was really upset because she didn't understand why we ordered something so expensive. I told her I was sorry for this but we were hoping they would offer a affordable option. She stated she would wait to move forward with anything until she sees him back in the office to discuss this

## 2023-11-20 NOTE — Telephone Encounter (Signed)
 We ordered Trivisc and she can't afford it, please advise on next steps

## 2023-11-20 NOTE — Telephone Encounter (Signed)
 We could repeat a steroid injection but would have to wait until November or consider a referral to pain management if there are other possible procedures that could be covered under medicaid.

## 2023-11-20 NOTE — Telephone Encounter (Signed)
 Patient called and said that she can not pay the 500 dollars self pay when she has medicaid. RA#[phone]

## 2023-11-25 ENCOUNTER — Ambulatory Visit

## 2023-11-26 ENCOUNTER — Other Ambulatory Visit: Payer: Self-pay | Admitting: Family Medicine

## 2023-11-26 ENCOUNTER — Telehealth: Payer: Self-pay

## 2023-11-26 DIAGNOSIS — K2100 Gastro-esophageal reflux disease with esophagitis, without bleeding: Secondary | ICD-10-CM

## 2023-11-26 NOTE — Telephone Encounter (Signed)
 FYI  Patient has declined to proceed with TriVisc, bilateral knee injections.

## 2023-11-26 NOTE — Telephone Encounter (Signed)
 noted

## 2023-12-06 ENCOUNTER — Ambulatory Visit

## 2024-01-07 ENCOUNTER — Ambulatory Visit: Admitting: Family Medicine

## 2024-01-14 ENCOUNTER — Ambulatory Visit: Admitting: Family Medicine

## 2024-01-14 VITALS — BP 102/65 | HR 89 | Ht 62.0 in | Wt 227.0 lb

## 2024-01-14 DIAGNOSIS — Z7984 Long term (current) use of oral hypoglycemic drugs: Secondary | ICD-10-CM | POA: Diagnosis not present

## 2024-01-14 DIAGNOSIS — E118 Type 2 diabetes mellitus with unspecified complications: Secondary | ICD-10-CM | POA: Diagnosis not present

## 2024-01-14 DIAGNOSIS — Z1231 Encounter for screening mammogram for malignant neoplasm of breast: Secondary | ICD-10-CM | POA: Diagnosis not present

## 2024-01-14 DIAGNOSIS — Z122 Encounter for screening for malignant neoplasm of respiratory organs: Secondary | ICD-10-CM

## 2024-01-14 LAB — POCT GLYCOSYLATED HEMOGLOBIN (HGB A1C): Hemoglobin A1C: 6.7 % — AB (ref 4.0–5.6)

## 2024-01-14 MED ORDER — METFORMIN HCL ER 500 MG PO TB24: 500.0000 mg | ORAL_TABLET | Freq: Every day | ORAL | 3 refills | Status: DC
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
## 2024-01-15 ENCOUNTER — Other Ambulatory Visit: Payer: Self-pay | Admitting: Family Medicine

## 2024-01-15 DIAGNOSIS — I10 Essential (primary) hypertension: Secondary | ICD-10-CM

## 2024-01-20 ENCOUNTER — Telehealth: Payer: Self-pay

## 2024-01-20 ENCOUNTER — Telehealth: Payer: Self-pay | Admitting: Family Medicine

## 2024-01-20 DIAGNOSIS — I10 Essential (primary) hypertension: Secondary | ICD-10-CM

## 2024-01-20 NOTE — Telephone Encounter (Signed)
 Copied from CRM [id number]. Topic: Clinical - Medication Question >> Jan 20, 2024 12:37 PM [name] wrote: Reason for CRM: Pt called in about her metformin  medication. Pt says she feels like the dosage is lower. And would like to provider or nurse to follow up with clarification on how to consume medication

## 2024-01-20 NOTE — Telephone Encounter (Unsigned)
 Copied from CRM #[id number]. Topic: Clinical - Medication Refill >> Jan 20, 2024 12:38 PM Ivette P wrote: Medication: losartan  (COZAAR ) 25 MG tablet   Has the patient contacted their pharmacy? Yes (Agent: If no, request that the patient contact the pharmacy for the refill. If patient does not wish to contact the pharmacy document the reason why and proceed with request.) (Agent: If yes, when and what did the pharmacy advise?)  This is the patient's preferred pharmacy:  [id number] [place], [place] - [address] [address] Phone: [phone] Fax: [phone]  Is this the correct pharmacy for this prescription? Yes If no, delete pharmacy and type the correct one.   Has the prescription been filled recently? No  Is the patient out of the medication? Yes  Has the patient been seen for an appointment in the last year OR does the patient have an upcoming appointment? Yes  Can we respond through MyChart? No  Agent: Please be advised that Rx refills may take up to 3 business days. We ask that you follow-up with your pharmacy.

## 2024-01-21 ENCOUNTER — Telehealth: Payer: Self-pay | Admitting: *Deleted

## 2024-01-21 MED ORDER — LOSARTAN POTASSIUM 25 MG PO TABS: 25.0000 mg | ORAL_TABLET | Freq: Every day | ORAL | 1 refills | Status: AC

## 2024-01-21 MED ORDER — METFORMIN HCL ER 500 MG PO TB24: 1000.0000 mg | ORAL_TABLET | Freq: Every day | ORAL | 3 refills | Status: AC

## 2024-01-21 NOTE — Telephone Encounter (Signed)
 This was sent in a listed below.

## 2024-01-21 NOTE — Addendum Note (Signed)
 Addended by: Demitri Kucinski K on: 01/21/2024 01:16 PM   Modules accepted: Orders

## 2024-01-21 NOTE — Telephone Encounter (Signed)
 Copied from CRM [id number]. Topic: Clinical - Prescription Issue >> Jan 21, 2024  2:26 PM Anairis L wrote: Reason for CRM: Patient is calling in because she was prescribed metFORMIN  (GLUCOPHAGE -XR) 500 MG 24 hr tablet but she needs the extend release form.    Please and thank you.

## 2024-02-28 ENCOUNTER — Other Ambulatory Visit: Payer: Self-pay

## 2024-02-28 ENCOUNTER — Other Ambulatory Visit: Payer: Self-pay | Admitting: Family Medicine

## 2024-02-28 DIAGNOSIS — F339 Major depressive disorder, recurrent, unspecified: Secondary | ICD-10-CM

## 2024-02-28 DIAGNOSIS — I10 Essential (primary) hypertension: Secondary | ICD-10-CM
